# Patient Record
Sex: Female | Born: 1990 | Race: White | Hispanic: No | Marital: Married | Smoking: Current every day smoker
Health system: Southern US, Community
[De-identification: ages and names within clinical notes are randomized; demographics above are authoritative.]

## PROBLEM LIST (undated history)

## (undated) ENCOUNTER — Inpatient Hospital Stay (HOSPITAL_COMMUNITY): Payer: Self-pay

## (undated) DIAGNOSIS — F329 Major depressive disorder, single episode, unspecified: Secondary | ICD-10-CM

## (undated) DIAGNOSIS — T1491XA Suicide attempt, initial encounter: Secondary | ICD-10-CM

## (undated) DIAGNOSIS — F4312 Post-traumatic stress disorder, chronic: Secondary | ICD-10-CM

## (undated) DIAGNOSIS — G4700 Insomnia, unspecified: Secondary | ICD-10-CM

## (undated) DIAGNOSIS — F319 Bipolar disorder, unspecified: Secondary | ICD-10-CM

## (undated) DIAGNOSIS — F32A Depression, unspecified: Secondary | ICD-10-CM

## (undated) DIAGNOSIS — F419 Anxiety disorder, unspecified: Secondary | ICD-10-CM

## (undated) DIAGNOSIS — Z8659 Personal history of other mental and behavioral disorders: Secondary | ICD-10-CM

## (undated) DIAGNOSIS — F603 Borderline personality disorder: Secondary | ICD-10-CM

## (undated) DIAGNOSIS — F1911 Other psychoactive substance abuse, in remission: Secondary | ICD-10-CM

## (undated) HISTORY — DX: Anxiety disorder, unspecified: F41.9

## (undated) HISTORY — DX: Depression, unspecified: F32.A

## (undated) HISTORY — PX: NO PAST SURGERIES: SHX2092

## (undated) HISTORY — DX: Major depressive disorder, single episode, unspecified: F32.9

## (undated) HISTORY — DX: Other psychoactive substance abuse, in remission: F19.11

---

## 2012-05-27 ENCOUNTER — Other Ambulatory Visit (HOSPITAL_COMMUNITY): Payer: Self-pay | Admitting: Obstetrics

## 2012-05-27 DIAGNOSIS — Z3480 Encounter for supervision of other normal pregnancy, unspecified trimester: Secondary | ICD-10-CM

## 2012-05-31 ENCOUNTER — Ambulatory Visit (HOSPITAL_COMMUNITY)
Admission: RE | Admit: 2012-05-31 | Discharge: 2012-05-31 | Disposition: A | Discharge status: Home / Self Care | Payer: Medicaid Other | Source: Ambulatory Visit | Attending: Obstetrics | Admitting: Obstetrics

## 2012-05-31 DIAGNOSIS — Z3480 Encounter for supervision of other normal pregnancy, unspecified trimester: Secondary | ICD-10-CM

## 2012-05-31 DIAGNOSIS — Z3689 Encounter for other specified antenatal screening: Secondary | ICD-10-CM | POA: Insufficient documentation

## 2012-06-20 LAB — OB RESULTS CONSOLE RPR: RPR: NONREACTIVE

## 2012-06-20 LAB — OB RESULTS CONSOLE GC/CHLAMYDIA: Chlamydia: NEGATIVE

## 2012-06-20 LAB — OB RESULTS CONSOLE ABO/RH: RH Type: POSITIVE

## 2012-06-20 LAB — OB RESULTS CONSOLE ANTIBODY SCREEN: Antibody Screen: NEGATIVE

## 2012-10-13 NOTE — L&D Delivery Note (Signed)
Delivery Note At 9:46 PM a viable female was delivered via Vaginal, Spontaneous Delivery (Presentation: ;  ).  APGAR: , ; weight .   Placenta status: , .  Cord:  with the following complications: .  Cord pH: not done  Anesthesia: Epidural  Episiotomy:  Lacerations:  Suture Repair: 2.0 vicryl Est. Blood Loss (mL):   Mom to postpartum.  Baby to nursery-stable.  MARSHALL,BERNARD A 01/03/2013, 9:59 PM

## 2012-11-30 LAB — OB RESULTS CONSOLE GBS: GBS: NEGATIVE

## 2012-12-29 ENCOUNTER — Encounter (HOSPITAL_COMMUNITY): Payer: Self-pay | Admitting: *Deleted

## 2012-12-29 ENCOUNTER — Telehealth (HOSPITAL_COMMUNITY): Payer: Self-pay | Admitting: *Deleted

## 2012-12-29 NOTE — Telephone Encounter (Signed)
Preadmission screen  

## 2013-01-01 ENCOUNTER — Inpatient Hospital Stay (HOSPITAL_COMMUNITY): Admission: AD | Admit: 2013-01-01 | Payer: Self-pay | Source: Ambulatory Visit | Admitting: Obstetrics

## 2013-01-03 ENCOUNTER — Encounter (HOSPITAL_COMMUNITY): Payer: Self-pay

## 2013-01-03 ENCOUNTER — Inpatient Hospital Stay (HOSPITAL_COMMUNITY): Payer: Medicaid Other | Admitting: Anesthesiology

## 2013-01-03 ENCOUNTER — Encounter (HOSPITAL_COMMUNITY): Payer: Self-pay | Admitting: Anesthesiology

## 2013-01-03 ENCOUNTER — Inpatient Hospital Stay (HOSPITAL_COMMUNITY)
Admission: RE | Admit: 2013-01-03 | Discharge: 2013-01-05 | DRG: 775 | Disposition: A | Discharge status: Home / Self Care | Payer: Medicaid Other | Source: Ambulatory Visit | Attending: Obstetrics | Admitting: Obstetrics

## 2013-01-03 LAB — CBC
Hemoglobin: 14.5 g/dL (ref 12.0–15.0)
MCH: 31.2 pg (ref 26.0–34.0)
RBC: 4.65 MIL/uL (ref 3.87–5.11)

## 2013-01-03 LAB — RPR: RPR Ser Ql: NONREACTIVE

## 2013-01-03 MED ORDER — LANOLIN HYDROUS EX OINT: TOPICAL_OINTMENT | CUTANEOUS | Status: DC | PRN

## 2013-01-03 MED ORDER — CITRIC ACID-SODIUM CITRATE 334-500 MG/5ML PO SOLN: 30.0000 mL | ORAL | Status: DC | PRN

## 2013-01-03 MED ORDER — OXYTOCIN 40 UNITS IN LACTATED RINGERS INFUSION - SIMPLE MED
1.0000 m[IU]/min | INTRAVENOUS | Status: DC
  Administered 2013-01-03: 2 m[IU]/min via INTRAVENOUS

## 2013-01-03 MED ORDER — PHENYLEPHRINE 40 MCG/ML (10ML) SYRINGE FOR IV PUSH (FOR BLOOD PRESSURE SUPPORT)
80.0000 ug | PREFILLED_SYRINGE | INTRAVENOUS | Status: DC | PRN
  Filled 2013-01-03: qty 2

## 2013-01-03 MED ORDER — LACTATED RINGERS IV SOLN
INTRAVENOUS | Status: DC
  Administered 2013-01-03 (×3): via INTRAVENOUS

## 2013-01-03 MED ORDER — EPHEDRINE 5 MG/ML INJ
10.0000 mg | INTRAVENOUS | Status: DC | PRN
  Filled 2013-01-03: qty 2

## 2013-01-03 MED ORDER — IBUPROFEN 600 MG PO TABS
600.0000 mg | ORAL_TABLET | Freq: Four times a day (QID) | ORAL | Status: DC
  Administered 2013-01-03 – 2013-01-05 (×6): 600 mg via ORAL
  Filled 2013-01-03: qty 1

## 2013-01-03 MED ORDER — OXYTOCIN 40 UNITS IN LACTATED RINGERS INFUSION - SIMPLE MED
62.5000 mL/h | INTRAVENOUS | Status: DC
  Administered 2013-01-03: 500 mL/h via INTRAVENOUS
  Filled 2013-01-03: qty 1000

## 2013-01-03 MED ORDER — FERROUS SULFATE 325 (65 FE) MG PO TABS
325.0000 mg | ORAL_TABLET | Freq: Two times a day (BID) | ORAL | Status: DC
  Administered 2013-01-04 – 2013-01-05 (×3): 325 mg via ORAL
  Filled 2013-01-03 (×3): qty 1

## 2013-01-03 MED ORDER — OXYTOCIN BOLUS FROM INFUSION: 500.0000 mL | INTRAVENOUS | Status: DC

## 2013-01-03 MED ORDER — OXYCODONE-ACETAMINOPHEN 5-325 MG PO TABS
1.0000 | ORAL_TABLET | ORAL | Status: DC | PRN
  Administered 2013-01-04 (×4): 2 via ORAL
  Administered 2013-01-05: 1 via ORAL
  Filled 2013-01-03: qty 2
  Filled 2013-01-03: qty 1
  Filled 2013-01-03 (×2): qty 2

## 2013-01-03 MED ORDER — LACTATED RINGERS IV SOLN: 500.0000 mL | INTRAVENOUS | Status: DC | PRN

## 2013-01-03 MED ORDER — ONDANSETRON HCL 4 MG/2ML IJ SOLN: 4.0000 mg | INTRAMUSCULAR | Status: DC | PRN

## 2013-01-03 MED ORDER — FENTANYL 2.5 MCG/ML BUPIVACAINE 1/10 % EPIDURAL INFUSION (WH - ANES)
14.0000 mL/h | INTRAMUSCULAR | Status: DC | PRN
  Administered 2013-01-03 (×2): 14 mL/h via EPIDURAL
  Filled 2013-01-03 (×2): qty 125

## 2013-01-03 MED ORDER — PRENATAL MULTIVITAMIN CH
1.0000 | ORAL_TABLET | Freq: Every day | ORAL | Status: DC
  Administered 2013-01-04: 1 via ORAL
  Filled 2013-01-03: qty 1

## 2013-01-03 MED ORDER — MISOPROSTOL 25 MCG QUARTER TABLET
25.0000 ug | ORAL_TABLET | ORAL | Status: DC | PRN
  Administered 2013-01-03: 25 ug via VAGINAL
  Filled 2013-01-03: qty 0.25
  Filled 2013-01-03: qty 1

## 2013-01-03 MED ORDER — ACETAMINOPHEN 325 MG PO TABS: 650.0000 mg | ORAL_TABLET | ORAL | Status: DC | PRN

## 2013-01-03 MED ORDER — EPHEDRINE 5 MG/ML INJ
10.0000 mg | INTRAVENOUS | Status: DC | PRN
  Filled 2013-01-03: qty 2
  Filled 2013-01-03: qty 4

## 2013-01-03 MED ORDER — TETANUS-DIPHTH-ACELL PERTUSSIS 5-2.5-18.5 LF-MCG/0.5 IM SUSP: 0.5000 mL | Freq: Once | INTRAMUSCULAR | Status: DC

## 2013-01-03 MED ORDER — OXYCODONE-ACETAMINOPHEN 5-325 MG PO TABS
1.0000 | ORAL_TABLET | ORAL | Status: DC | PRN
  Administered 2013-01-04 – 2013-01-05 (×2): 1 via ORAL
  Filled 2013-01-03: qty 2
  Filled 2013-01-03: qty 1
  Filled 2013-01-03 (×2): qty 2

## 2013-01-03 MED ORDER — DIBUCAINE 1 % RE OINT
1.0000 "application " | TOPICAL_OINTMENT | RECTAL | Status: DC | PRN
  Administered 2013-01-04: 1 via RECTAL
  Filled 2013-01-03: qty 28

## 2013-01-03 MED ORDER — DIPHENHYDRAMINE HCL 25 MG PO CAPS: 25.0000 mg | ORAL_CAPSULE | Freq: Four times a day (QID) | ORAL | Status: DC | PRN

## 2013-01-03 MED ORDER — LACTATED RINGERS IV SOLN
500.0000 mL | Freq: Once | INTRAVENOUS | Status: AC
  Administered 2013-01-03: 500 mL via INTRAVENOUS

## 2013-01-03 MED ORDER — BENZOCAINE-MENTHOL 20-0.5 % EX AERO
1.0000 "application " | INHALATION_SPRAY | CUTANEOUS | Status: DC | PRN
  Filled 2013-01-03: qty 56

## 2013-01-03 MED ORDER — BUTORPHANOL TARTRATE 1 MG/ML IJ SOLN: 1.0000 mg | INTRAMUSCULAR | Status: DC | PRN

## 2013-01-03 MED ORDER — PHENYLEPHRINE 40 MCG/ML (10ML) SYRINGE FOR IV PUSH (FOR BLOOD PRESSURE SUPPORT)
80.0000 ug | PREFILLED_SYRINGE | INTRAVENOUS | Status: DC | PRN
  Filled 2013-01-03: qty 5
  Filled 2013-01-03: qty 2

## 2013-01-03 MED ORDER — ZOLPIDEM TARTRATE 5 MG PO TABS: 5.0000 mg | ORAL_TABLET | Freq: Every evening | ORAL | Status: DC | PRN

## 2013-01-03 MED ORDER — DIPHENHYDRAMINE HCL 50 MG/ML IJ SOLN: 12.5000 mg | INTRAMUSCULAR | Status: DC | PRN

## 2013-01-03 MED ORDER — ONDANSETRON HCL 4 MG PO TABS: 4.0000 mg | ORAL_TABLET | ORAL | Status: DC | PRN

## 2013-01-03 MED ORDER — WITCH HAZEL-GLYCERIN EX PADS
1.0000 "application " | MEDICATED_PAD | CUTANEOUS | Status: DC | PRN
  Administered 2013-01-04: 1 via TOPICAL

## 2013-01-03 MED ORDER — SENNOSIDES-DOCUSATE SODIUM 8.6-50 MG PO TABS
2.0000 | ORAL_TABLET | Freq: Every day | ORAL | Status: DC
  Administered 2013-01-04: 2 via ORAL

## 2013-01-03 MED ORDER — LIDOCAINE HCL (PF) 1 % IJ SOLN
INTRAMUSCULAR | Status: DC | PRN
  Administered 2013-01-03 (×4): 4 mL

## 2013-01-03 MED ORDER — SIMETHICONE 80 MG PO CHEW: 80.0000 mg | CHEWABLE_TABLET | ORAL | Status: DC | PRN

## 2013-01-03 MED ORDER — LIDOCAINE HCL (PF) 1 % IJ SOLN
30.0000 mL | INTRAMUSCULAR | Status: DC | PRN
  Administered 2013-01-03: 30 mL via SUBCUTANEOUS
  Filled 2013-01-03 (×3): qty 30

## 2013-01-03 MED ORDER — TERBUTALINE SULFATE 1 MG/ML IJ SOLN: 0.2500 mg | Freq: Once | INTRAMUSCULAR | Status: DC | PRN

## 2013-01-03 MED ORDER — ONDANSETRON HCL 4 MG/2ML IJ SOLN: 4.0000 mg | Freq: Four times a day (QID) | INTRAMUSCULAR | Status: DC | PRN

## 2013-01-03 MED ORDER — FLEET ENEMA 7-19 GM/118ML RE ENEM: 1.0000 | ENEMA | RECTAL | Status: DC | PRN

## 2013-01-03 MED ORDER — IBUPROFEN 600 MG PO TABS
600.0000 mg | ORAL_TABLET | Freq: Four times a day (QID) | ORAL | Status: DC | PRN
  Filled 2013-01-03 (×4): qty 1

## 2013-01-03 NOTE — H&P (Signed)
This is Dr. Francoise Ceo dictating the history and physical on Jenna Wilson she's a 22 year old gravida 1 at 40 weeks and 1 day her EDC is 01/02/2013 and she is admitted for induction she was not contracting and received Cytotec at 845 this morning intravaginally she is now 1 cm dilated 70% amniotomy performed the fluids clear and the vertex at -2 station she is also on low-dose Pitocin which was started in in Past medical history is a history of heroin use which was stopped a one-year prior to her pregnancy Past surgical history negative Social history negative System review negative Physical exam well-developed female in no distress HEENT negative Breasts negative Lungs clear to P&A Heart regular rhythm no murmurs no gallops Abdomen term Pelvic as described above Extremities negative and

## 2013-01-03 NOTE — Anesthesia Procedure Notes (Signed)
Epidural Patient location during procedure: OB Start time: 01/03/2013 1:46 PM  Staffing Performed by: anesthesiologist   Preanesthetic Checklist Completed: patient identified, site marked, surgical consent, pre-op evaluation, timeout performed, IV checked, risks and benefits discussed and monitors and equipment checked  Epidural Patient position: sitting Prep: site prepped and draped and DuraPrep Patient monitoring: continuous pulse ox and blood pressure Approach: midline Injection technique: LOR air  Needle:  Needle type: Tuohy  Needle gauge: 17 G Needle length: 9 cm and 9 Needle insertion depth: 5 cm cm Catheter type: closed end flexible Catheter size: 19 Gauge Catheter at skin depth: 10 cm Test dose: negative  Assessment Events: blood not aspirated, injection not painful, no injection resistance, negative IV test and no paresthesia  Additional Notes Discussed risk of headache, infection, bleeding, nerve injury and failed or incomplete block.  Patient voices understanding and wishes to proceed.  Epidural placed easily on first attempt.  No paresthesia.  Patient tolerated procedure well with no apparent complications.  Jasmine December, MD Reason for block:procedure for pain

## 2013-01-03 NOTE — Anesthesia Preprocedure Evaluation (Signed)
Anesthesia Evaluation  Patient identified by MRN, date of birth, ID band Patient awake    Reviewed: Allergy & Precautions, H&P , NPO status , Patient's Chart, lab work & pertinent test results, reviewed documented beta blocker date and time   History of Anesthesia Complications Negative for: history of anesthetic complications  Airway Mallampati: II TM Distance: >3 FB Neck ROM: full    Dental  (+) Teeth Intact   Pulmonary neg pulmonary ROS,  breath sounds clear to auscultation        Cardiovascular negative cardio ROS  Rhythm:regular Rate:Normal     Neuro/Psych negative neurological ROS  negative psych ROS   GI/Hepatic negative GI ROS, (+)     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,   Endo/Other  negative endocrine ROS  Renal/GU negative Renal ROS     Musculoskeletal   Abdominal   Peds  Hematology negative hematology ROS (+)   Anesthesia Other Findings   Reproductive/Obstetrics (+) Pregnancy                           Anesthesia Physical Anesthesia Plan  ASA: II  Anesthesia Plan: Epidural   Post-op Pain Management:    Induction:   Airway Management Planned:   Additional Equipment:   Intra-op Plan:   Post-operative Plan:   Informed Consent: I have reviewed the patients History and Physical, chart, labs and discussed the procedure including the risks, benefits and alternatives for the proposed anesthesia with the patient or authorized representative who has indicated his/her understanding and acceptance.     Plan Discussed with:   Anesthesia Plan Comments:         Anesthesia Quick Evaluation

## 2013-01-04 ENCOUNTER — Encounter (HOSPITAL_COMMUNITY): Payer: Self-pay

## 2013-01-04 LAB — CBC
MCH: 30.1 pg (ref 26.0–34.0)
MCHC: 34.8 g/dL (ref 30.0–36.0)
Platelets: 186 10*3/uL (ref 150–400)

## 2013-01-04 NOTE — Anesthesia Postprocedure Evaluation (Signed)
  Anesthesia Post-op Note  Patient: Jenna Wilson  Procedure(s) Performed: * No procedures listed *  Patient Location: PACU and Mother/Baby  Anesthesia Type:Epidural  Level of Consciousness: awake, alert  and oriented  Airway and Oxygen Therapy: Patient Spontanous Breathing  Post-op Pain: mild  Post-op Assessment: Patient's Cardiovascular Status Stable, Respiratory Function Stable, Patent Airway, Adequate PO intake, Pain level controlled, No headache, No backache, No residual numbness and No residual motor weakness  Post-op Vital Signs: stable  Complications: No apparent anesthesia complications

## 2013-01-04 NOTE — Progress Notes (Signed)
UR chart review completed.  

## 2013-01-04 NOTE — Progress Notes (Signed)
Patient ID: Jenna Wilson, female   DOB: 1991/02/27, 22 y.o.   MRN: [id number] Postpartum day one Vital signs normal Fundus firm Lochia moderate Legs negative doing well and and

## 2013-01-04 NOTE — Clinical Social Work Maternal (Signed)
    Clinical Social Work Department PSYCHOSOCIAL ASSESSMENT - MATERNAL/CHILD 01/04/2013  Patient:  Jenna Wilson, Jenna Wilson  Account Number:  [id number]  Admit Date:  01/03/2013  Marjo Bicker Name:   Jenna Wilson    Clinical Social Worker:  Lulu Riding, LCSW   Date/Time:  01/04/2013 12:45 PM  Date Referred:  01/04/2013   Referral source  CN     Referred reason  Substance Abuse   Other referral source:    I:  FAMILY / HOME ENVIRONMENT Child's legal guardian:  PARENT  Guardian - Name Guardian - Age Guardian - Address  Jenna Wilson [address] [place], [place] [address]  Jenna Wilson     Other household support members/support persons Other support:   MOB reports having a good support system.    II  PSYCHOSOCIAL DATA Information Source:  Patient Interview  Event organiser Employment:   Financial resources:  [employer] If [employer] - County:  [employer] / Grade:   Maternity Care Coordinator / Child Services Coordination / Early Interventions:  Cultural issues impacting care:   None indicated    III  STRENGTHS Strengths  Adequate Resources  Compliance with medical plan  Home prepared for Child (including basic supplies)  Supportive family/friends   Strength comment:    IV  RISK FACTORS AND CURRENT PROBLEMS Current Problem:  None     V  SOCIAL WORK ASSESSMENT  CSW met with MOB and FOB in MOB's first floor room/142 to complete assessment for history of heroine use.  MOB told CSW that CSW could come in and talk with them, but complained about the amount of people in and out of the room, stating they could not get any rest.  CSW offered twice to come back at a later time that might be better for MOB, but she insisted that there would not be a better time and that CSW could talk with her now.  She seemed very tired and CSW does not feel it was an entirely meaningful conversation.  However, MOB gave permission to talk about anything with FOB present and seemed  open about her drug hx.  She states she went to treatment at [facility], a [facility] in [place] approximately 3 years ago and has no used since that time.  CSW commended her on her sobriety.  She reports having a good support system and everything she needs for baby at home.  CSW discussed signs and symptoms of PPD and, given this is her first baby, and gave "Feelings After Birth" handout.  She reports no concerns, questions or needs.     VI SOCIAL WORK PLAN Social Work Plan  No Further Intervention Required / No Barriers to Discharge   Type of pt/family education:   PPD signs and symptoms   If child protective services report - county:   If child protective services report - date:   Information/referral to community resources comment:   Resources given on "Feelings After Birth" handout in the event PPD symptoms arise.   Other social work plan:    MD has ordered a drug screen and CSW will monitor results.

## 2013-01-04 NOTE — Progress Notes (Signed)
Postpartum day one Vital signs normal Fundus firm Lochia moderate Legs negative doing well 

## 2013-01-05 NOTE — Discharge Summary (Signed)
Obstetric Discharge Summary Reason for Admission: induction of labor Prenatal Procedures: none Intrapartum Procedures: spontaneous vaginal delivery Postpartum Procedures: none Complications-Operative and Postpartum: none Hemoglobin  Date Value Range Status  01/04/2013 12.3  12.0 - 15.0 g/dL Final     HCT  Date Value Range Status  01/04/2013 35.3* 36.0 - 46.0 % Final    Physical Exam:  General: alert Lochia: appropriate Uterine Fundus: firm Incision: healing well DVT Evaluation: No evidence of DVT seen on physical exam.  Discharge Diagnoses: Term Pregnancy-delivered  Discharge Information: Date: 01/05/2013 Activity: pelvic rest Diet: routine Medications: Percocet Condition: stable Instructions: refer to practice specific booklet Discharge to: home Follow-up Information   Follow up with Florian Chauca A, MD. Schedule an appointment as soon as possible for a visit in 6 weeks.   Contact information:   [address] [place] [address] [phone]       Newborn Data: Live born female  Birth Weight: 6 lb 15.5 oz (3161 g) APGAR: 9, 9  Home with mother.  Francys Bolin A 01/05/2013, 7:03 AM

## 2013-10-13 NOTE — L&D Delivery Note (Signed)
Delivery Note At 9:16 PM a viable female was delivered via Vaginal, Spontaneous Delivery (Presentation: Middle Occiput Anterior).  APGAR: , ; weight .   Placenta status: Intact, Spontaneous.  Cord: 3 vessels with the following complications: None.  Cord pH: not done  Anesthesia: Epidural  Episiotomy:  Lacerations:  Suture Repair: 2.0 vicryl Est. Blood Loss (mL):   Mom to postpartum.  Baby to Couplet care / Skin to Skin.  Kathreen Cosier 03/21/2014, 9:27 PM

## 2013-11-08 LAB — OB RESULTS CONSOLE GC/CHLAMYDIA
CHLAMYDIA, DNA PROBE: NEGATIVE
GC PROBE AMP, GENITAL: NEGATIVE

## 2013-11-08 LAB — OB RESULTS CONSOLE RUBELLA ANTIBODY, IGM: Rubella: IMMUNE

## 2013-11-08 LAB — OB RESULTS CONSOLE HIV ANTIBODY (ROUTINE TESTING): HIV: NONREACTIVE

## 2013-11-08 LAB — OB RESULTS CONSOLE GBS: GBS: NEGATIVE

## 2013-11-08 LAB — OB RESULTS CONSOLE ABO/RH: RH TYPE: POSITIVE

## 2013-11-08 LAB — OB RESULTS CONSOLE ANTIBODY SCREEN: Antibody Screen: NEGATIVE

## 2013-11-08 LAB — OB RESULTS CONSOLE RPR: RPR: NONREACTIVE

## 2013-11-08 LAB — OB RESULTS CONSOLE HEPATITIS B SURFACE ANTIGEN: HEP B S AG: NEGATIVE

## 2014-02-09 LAB — OB RESULTS CONSOLE GC/CHLAMYDIA
CHLAMYDIA, DNA PROBE: NEGATIVE
Gonorrhea: NEGATIVE

## 2014-02-09 LAB — OB RESULTS CONSOLE GBS: STREP GROUP B AG: NEGATIVE

## 2014-03-11 ENCOUNTER — Inpatient Hospital Stay (HOSPITAL_COMMUNITY)
Admission: AD | Admit: 2014-03-11 | Discharge: 2014-03-11 | Disposition: A | Discharge status: Home / Self Care | Payer: Medicaid Other | Source: Ambulatory Visit | Attending: Obstetrics | Admitting: Obstetrics

## 2014-03-11 ENCOUNTER — Encounter (HOSPITAL_COMMUNITY): Payer: Self-pay | Admitting: *Deleted

## 2014-03-11 DIAGNOSIS — O479 False labor, unspecified: Secondary | ICD-10-CM | POA: Insufficient documentation

## 2014-03-11 MED ORDER — ZOLPIDEM TARTRATE 5 MG PO TABS
5.0000 mg | ORAL_TABLET | Freq: Once | ORAL | Status: AC
  Administered 2014-03-11: 5 mg via ORAL
  Filled 2014-03-11: qty 1

## 2014-03-11 NOTE — MAU Note (Signed)
Contractions started earlier today and now are stronger and closer. Denies bleeding or leaking fld

## 2014-03-17 ENCOUNTER — Telehealth (HOSPITAL_COMMUNITY): Payer: Self-pay | Admitting: *Deleted

## 2014-03-17 ENCOUNTER — Inpatient Hospital Stay (HOSPITAL_COMMUNITY): Payer: Medicaid Other

## 2014-03-17 ENCOUNTER — Encounter (HOSPITAL_COMMUNITY): Payer: Self-pay | Admitting: *Deleted

## 2014-03-17 NOTE — Telephone Encounter (Signed)
Preadmission screen  

## 2014-03-21 ENCOUNTER — Inpatient Hospital Stay (HOSPITAL_COMMUNITY)
Admission: RE | Admit: 2014-03-21 | Discharge: 2014-03-23 | DRG: 775 | Disposition: A | Discharge status: Home / Self Care | Payer: Medicaid Other | Source: Ambulatory Visit | Attending: Obstetrics | Admitting: Obstetrics

## 2014-03-21 ENCOUNTER — Inpatient Hospital Stay (HOSPITAL_COMMUNITY): Payer: Medicaid Other | Admitting: Anesthesiology

## 2014-03-21 ENCOUNTER — Encounter (HOSPITAL_COMMUNITY): Payer: Medicaid Other | Admitting: Anesthesiology

## 2014-03-21 ENCOUNTER — Encounter (HOSPITAL_COMMUNITY): Payer: Self-pay

## 2014-03-21 LAB — CBC
HEMATOCRIT: 39.8 % (ref 36.0–46.0)
Hemoglobin: 14.1 g/dL (ref 12.0–15.0)
MCH: 30.8 pg (ref 26.0–34.0)
MCHC: 35.4 g/dL (ref 30.0–36.0)
MCV: 86.9 fL (ref 78.0–100.0)
PLATELETS: 128 10*3/uL — AB (ref 150–400)
RBC: 4.58 MIL/uL (ref 3.87–5.11)
RDW: 12.9 % (ref 11.5–15.5)
WBC: 9.2 10*3/uL (ref 4.0–10.5)

## 2014-03-21 LAB — RPR

## 2014-03-21 MED ORDER — DIPHENHYDRAMINE HCL 50 MG/ML IJ SOLN: 12.5000 mg | INTRAMUSCULAR | Status: DC | PRN

## 2014-03-21 MED ORDER — PHENYLEPHRINE 40 MCG/ML (10ML) SYRINGE FOR IV PUSH (FOR BLOOD PRESSURE SUPPORT)
80.0000 ug | PREFILLED_SYRINGE | INTRAVENOUS | Status: DC | PRN
  Filled 2014-03-21: qty 2

## 2014-03-21 MED ORDER — METHYLERGONOVINE MALEATE 0.2 MG/ML IJ SOLN
INTRAMUSCULAR | Status: AC
  Filled 2014-03-21: qty 1

## 2014-03-21 MED ORDER — LACTATED RINGERS IV SOLN
500.0000 mL | Freq: Once | INTRAVENOUS | Status: AC
  Administered 2014-03-21: 500 mL via INTRAVENOUS

## 2014-03-21 MED ORDER — OXYTOCIN 40 UNITS IN LACTATED RINGERS INFUSION - SIMPLE MED
1.0000 m[IU]/min | INTRAVENOUS | Status: DC
  Administered 2014-03-21: 2 m[IU]/min via INTRAVENOUS

## 2014-03-21 MED ORDER — LACTATED RINGERS IV SOLN
INTRAVENOUS | Status: DC
  Administered 2014-03-21 (×2): via INTRAVENOUS

## 2014-03-21 MED ORDER — IBUPROFEN 600 MG PO TABS
600.0000 mg | ORAL_TABLET | Freq: Four times a day (QID) | ORAL | Status: DC | PRN
  Administered 2014-03-21: 600 mg via ORAL
  Filled 2014-03-21: qty 1

## 2014-03-21 MED ORDER — CITRIC ACID-SODIUM CITRATE 334-500 MG/5ML PO SOLN: 30.0000 mL | ORAL | Status: DC | PRN

## 2014-03-21 MED ORDER — ACETAMINOPHEN 325 MG PO TABS: 650.0000 mg | ORAL_TABLET | ORAL | Status: DC | PRN

## 2014-03-21 MED ORDER — FLEET ENEMA 7-19 GM/118ML RE ENEM: 1.0000 | ENEMA | RECTAL | Status: DC | PRN

## 2014-03-21 MED ORDER — BUTORPHANOL TARTRATE 1 MG/ML IJ SOLN: 1.0000 mg | INTRAMUSCULAR | Status: DC | PRN

## 2014-03-21 MED ORDER — FENTANYL 2.5 MCG/ML BUPIVACAINE 1/10 % EPIDURAL INFUSION (WH - ANES): 14.0000 mL/h | INTRAMUSCULAR | Status: DC | PRN

## 2014-03-21 MED ORDER — EPHEDRINE 5 MG/ML INJ
10.0000 mg | INTRAVENOUS | Status: DC | PRN
  Filled 2014-03-21: qty 2

## 2014-03-21 MED ORDER — OXYCODONE-ACETAMINOPHEN 5-325 MG PO TABS
1.0000 | ORAL_TABLET | ORAL | Status: DC | PRN
  Administered 2014-03-21: 1 via ORAL
  Filled 2014-03-21 (×2): qty 1

## 2014-03-21 MED ORDER — SODIUM BICARBONATE 8.4 % IV SOLN
INTRAVENOUS | Status: DC | PRN
  Administered 2014-03-21: 5 mL via EPIDURAL

## 2014-03-21 MED ORDER — PHENYLEPHRINE 40 MCG/ML (10ML) SYRINGE FOR IV PUSH (FOR BLOOD PRESSURE SUPPORT)
PREFILLED_SYRINGE | INTRAVENOUS | Status: AC
  Filled 2014-03-21: qty 10

## 2014-03-21 MED ORDER — METHYLERGONOVINE MALEATE 0.2 MG PO TABS
0.2000 mg | ORAL_TABLET | ORAL | Status: AC
  Administered 2014-03-22 – 2014-03-23 (×6): 0.2 mg via ORAL
  Filled 2014-03-21 (×6): qty 1

## 2014-03-21 MED ORDER — FENTANYL 2.5 MCG/ML BUPIVACAINE 1/10 % EPIDURAL INFUSION (WH - ANES)
INTRAMUSCULAR | Status: AC
  Administered 2014-03-21: 14 mL/h via EPIDURAL
  Filled 2014-03-21: qty 125

## 2014-03-21 MED ORDER — TERBUTALINE SULFATE 1 MG/ML IJ SOLN: 0.2500 mg | Freq: Once | INTRAMUSCULAR | Status: AC | PRN

## 2014-03-21 MED ORDER — EPHEDRINE 5 MG/ML INJ
INTRAVENOUS | Status: AC
  Filled 2014-03-21: qty 4

## 2014-03-21 MED ORDER — LACTATED RINGERS IV SOLN: 500.0000 mL | INTRAVENOUS | Status: DC | PRN

## 2014-03-21 MED ORDER — LIDOCAINE HCL (PF) 1 % IJ SOLN
30.0000 mL | INTRAMUSCULAR | Status: DC | PRN
  Filled 2014-03-21: qty 30

## 2014-03-21 MED ORDER — OXYTOCIN 40 UNITS IN LACTATED RINGERS INFUSION - SIMPLE MED
62.5000 mL/h | INTRAVENOUS | Status: DC
  Filled 2014-03-21: qty 1000

## 2014-03-21 MED ORDER — ONDANSETRON HCL 4 MG/2ML IJ SOLN: 4.0000 mg | Freq: Four times a day (QID) | INTRAMUSCULAR | Status: DC | PRN

## 2014-03-21 MED ORDER — OXYTOCIN BOLUS FROM INFUSION: 500.0000 mL | INTRAVENOUS | Status: DC

## 2014-03-21 MED ORDER — METHYLERGONOVINE MALEATE 0.2 MG/ML IJ SOLN
0.2000 mg | Freq: Once | INTRAMUSCULAR | Status: AC
  Administered 2014-03-21: 0.2 mg via INTRAMUSCULAR

## 2014-03-21 NOTE — Anesthesia Procedure Notes (Signed)

## 2014-03-21 NOTE — Anesthesia Preprocedure Evaluation (Signed)

## 2014-03-21 NOTE — H&P (Signed)
This is Dr. Francoise Ceo dictating the history and physical on  Jenna Wilson she's a 23 year old gravida 2 para 1001 at 40 weeks and 5 days EDC 03/16/2014 negative GBS brought in for induction today and she is now 5 cm 70% vertex -2 amniotomy performed the fluids clear she is on Pitocin and contracting every 3-5 minutes Past medical history negative Past surgical history negative Social history negative System review noncontributory Physical exam well-developed female in labor HEENT negative Lungs clear to P&A Heart regular rhythm no murmurs no gallops Breasts negative Abdomen term Pelvic as described above Extremities negative

## 2014-03-22 LAB — CBC
HCT: 36.9 % (ref 36.0–46.0)
Hemoglobin: 12.7 g/dL (ref 12.0–15.0)
MCH: 30.2 pg (ref 26.0–34.0)
MCHC: 34.4 g/dL (ref 30.0–36.0)
MCV: 87.9 fL (ref 78.0–100.0)
PLATELETS: 117 10*3/uL — AB (ref 150–400)
RBC: 4.2 MIL/uL (ref 3.87–5.11)
RDW: 12.9 % (ref 11.5–15.5)
WBC: 15.2 10*3/uL — AB (ref 4.0–10.5)

## 2014-03-22 MED ORDER — FERROUS SULFATE 325 (65 FE) MG PO TABS
325.0000 mg | ORAL_TABLET | Freq: Two times a day (BID) | ORAL | Status: DC
  Administered 2014-03-22 (×2): 325 mg via ORAL
  Filled 2014-03-22 (×2): qty 1

## 2014-03-22 MED ORDER — OXYCODONE-ACETAMINOPHEN 5-325 MG PO TABS
1.0000 | ORAL_TABLET | ORAL | Status: DC | PRN
  Administered 2014-03-22 – 2014-03-23 (×5): 2 via ORAL
  Administered 2014-03-23: 1 via ORAL
  Filled 2014-03-22 (×3): qty 2
  Filled 2014-03-22: qty 1
  Filled 2014-03-22 (×2): qty 2

## 2014-03-22 MED ORDER — SIMETHICONE 80 MG PO CHEW
80.0000 mg | CHEWABLE_TABLET | ORAL | Status: DC | PRN
  Administered 2014-03-22: 80 mg via ORAL
  Filled 2014-03-22: qty 1

## 2014-03-22 MED ORDER — ONDANSETRON HCL 4 MG/2ML IJ SOLN: 4.0000 mg | INTRAMUSCULAR | Status: DC | PRN

## 2014-03-22 MED ORDER — PRENATAL MULTIVITAMIN CH
1.0000 | ORAL_TABLET | Freq: Every day | ORAL | Status: DC
Start: 2014-03-22 — End: 2014-03-23
  Administered 2014-03-22: 1 via ORAL
  Filled 2014-03-22: qty 1

## 2014-03-22 MED ORDER — IBUPROFEN 600 MG PO TABS
600.0000 mg | ORAL_TABLET | Freq: Four times a day (QID) | ORAL | Status: DC
  Administered 2014-03-22 – 2014-03-23 (×6): 600 mg via ORAL
  Filled 2014-03-22 (×6): qty 1

## 2014-03-22 MED ORDER — LANOLIN HYDROUS EX OINT: TOPICAL_OINTMENT | CUTANEOUS | Status: DC | PRN

## 2014-03-22 MED ORDER — DIBUCAINE 1 % RE OINT: 1.0000 "application " | TOPICAL_OINTMENT | RECTAL | Status: DC | PRN

## 2014-03-22 MED ORDER — ZOLPIDEM TARTRATE 5 MG PO TABS
5.0000 mg | ORAL_TABLET | Freq: Every evening | ORAL | Status: DC | PRN
  Administered 2014-03-22: 5 mg via ORAL
  Filled 2014-03-22: qty 1

## 2014-03-22 MED ORDER — SENNOSIDES-DOCUSATE SODIUM 8.6-50 MG PO TABS
2.0000 | ORAL_TABLET | ORAL | Status: DC
  Administered 2014-03-23: 2 via ORAL
  Filled 2014-03-22: qty 2

## 2014-03-22 MED ORDER — DIPHENHYDRAMINE HCL 25 MG PO CAPS: 25.0000 mg | ORAL_CAPSULE | Freq: Four times a day (QID) | ORAL | Status: DC | PRN

## 2014-03-22 MED ORDER — BENZOCAINE-MENTHOL 20-0.5 % EX AERO: 1.0000 "application " | INHALATION_SPRAY | CUTANEOUS | Status: DC | PRN

## 2014-03-22 MED ORDER — METHYLERGONOVINE MALEATE 0.2 MG PO TABS: 0.2000 mg | ORAL_TABLET | ORAL | Status: DC

## 2014-03-22 MED ORDER — WITCH HAZEL-GLYCERIN EX PADS: 1.0000 "application " | MEDICATED_PAD | CUTANEOUS | Status: DC | PRN

## 2014-03-22 MED ORDER — TETANUS-DIPHTH-ACELL PERTUSSIS 5-2.5-18.5 LF-MCG/0.5 IM SUSP: 0.5000 mL | Freq: Once | INTRAMUSCULAR | Status: DC

## 2014-03-22 MED ORDER — ONDANSETRON HCL 4 MG PO TABS: 4.0000 mg | ORAL_TABLET | ORAL | Status: DC | PRN

## 2014-03-22 NOTE — Anesthesia Postprocedure Evaluation (Signed)
Anesthesia Post Note  Patient: Jenna Wilson  Procedure(s) Performed: * No procedures listed *  Anesthesia type: Epidural  Patient location: Mother/Baby  Post pain: Pain level controlled  Post assessment: Post-op Vital signs reviewed  Last Vitals:  Filed Vitals:   03/22/14 0534  BP: 106/71  Pulse: 69  Temp: 36.4 C  Resp: 16    Post vital signs: Reviewed  Level of consciousness:alert  Complications: No apparent anesthesia complications

## 2014-03-22 NOTE — Progress Notes (Signed)
Ur chart review completed.  

## 2014-03-22 NOTE — Progress Notes (Signed)
Patient ID: Jenna Wilson, female   DOB: 1991-09-21, 23 y.o.   MRN: [id number] Postpartum day one Vital signs normal Fundus firm Lochia moderate Legs negative doing well

## 2014-03-23 NOTE — Progress Notes (Signed)
Patient ID: Jenna Wilson, female   DOB: October 02, 1991, 23 y.o.   MRN: [id number] Vital signs normal Fundus firm Lochia moderate Discharge today

## 2014-03-23 NOTE — Discharge Instructions (Signed)
Discharge instructions   You can wash your hair  Shower  Eat what you want  Drink what you want  See me in 6 weeks  Your ankles are going to swell more in the next 2 weeks than when pregnant  No sex for 6 weeks   Holmes Hays A, MD 03/23/2014

## 2014-03-23 NOTE — Discharge Summary (Signed)
Obstetric Discharge Summary Reason for Admission: induction of labor Prenatal Procedures: none Intrapartum Procedures: spontaneous vaginal delivery Postpartum Procedures: none Complications-Operative and Postpartum: none Hemoglobin  Date Value Ref Range Status  03/22/2014 12.7  12.0 - 15.0 g/dL Final     HCT  Date Value Ref Range Status  03/22/2014 36.9  36.0 - 46.0 % Final    Physical Exam:  General: alert Lochia: appropriate Uterine Fundus: firm Incision: healing well DVT Evaluation: No evidence of DVT seen on physical exam.  Discharge Diagnoses: Term Pregnancy-delivered  Discharge Information: Date: 03/23/2014 Activity: pelvic rest Diet: routine Medications: Percocet Condition: stable Instructions: refer to practice specific booklet Discharge to: home Follow-up Information   Follow up with Kathreen Cosier, MD.   Specialty:  Obstetrics and Gynecology   Contact information:   [address] [place] [address] [phone]       Newborn Data: Live born female  Birth Weight: 7 lb 1.1 oz (3205 g) APGAR: 9, 9  Home with mother.  MARSHALL,BERNARD A 03/23/2014, 7:04 AM

## 2014-05-05 DIAGNOSIS — O99345 Other mental disorders complicating the puerperium: Secondary | ICD-10-CM

## 2014-05-05 DIAGNOSIS — F53 Postpartum depression: Secondary | ICD-10-CM | POA: Insufficient documentation

## 2014-08-14 ENCOUNTER — Encounter (HOSPITAL_COMMUNITY): Payer: Self-pay

## 2016-05-06 DIAGNOSIS — F411 Generalized anxiety disorder: Secondary | ICD-10-CM | POA: Insufficient documentation

## 2016-07-05 LAB — HM PAP SMEAR

## 2016-11-25 ENCOUNTER — Ambulatory Visit (HOSPITAL_COMMUNITY)
Admission: RE | Admit: 2016-11-25 | Discharge: 2016-11-25 | Disposition: A | Discharge status: Home / Self Care | Payer: 59 | Attending: Psychiatry | Admitting: Psychiatry

## 2016-11-25 DIAGNOSIS — F332 Major depressive disorder, recurrent severe without psychotic features: Secondary | ICD-10-CM | POA: Insufficient documentation

## 2016-11-25 DIAGNOSIS — Z79899 Other long term (current) drug therapy: Secondary | ICD-10-CM | POA: Insufficient documentation

## 2016-11-25 NOTE — Consult Note (Signed)
Behavioral Health Medical Screening Exam  Jenna Wilson is an 26 y.o. female.  Total Time spent with patient: 15 minutes  Psychiatric Specialty Exam: Physical Exam  Constitutional: She is oriented to person, place, and time. She appears well-developed and well-nourished.  HENT:  Head: Normocephalic and atraumatic.  Eyes: Conjunctivae are normal. Pupils are equal, round, and reactive to light.  Neck: Normal range of motion.  Cardiovascular: Normal rate, regular rhythm and normal heart sounds.   Respiratory: Effort normal and breath sounds normal.  GI: Soft. Bowel sounds are normal.  Musculoskeletal: Normal range of motion.  Neurological: She is alert and oriented to person, place, and time.  Skin: Skin is warm and dry.    Review of Systems  Psychiatric/Behavioral: Positive for depression. Negative for hallucinations and suicidal ideas. The patient is nervous/anxious and has insomnia.   All other systems reviewed and are negative.   Blood pressure 107/75, pulse 80, temperature 98.2 F (36.8 C), resp. rate 18, SpO2 100 %, unknown if currently breastfeeding.There is no height or weight on file to calculate BMI.  General Appearance: Casual and Fairly Groomed  Eye Contact:  Good  Speech:  Clear and Coherent and Normal Rate  Volume:  Normal  Mood:  Anxious and Depressed  Affect:  Appropriate, Congruent and Depressed  Thought Process:  Coherent, Goal Directed, Linear and Descriptions of Associations: Intact  Orientation:  Full (Time, Place, and Person)  Thought Content:  Focused on coming in  Suicidal Thoughts:  denies  Homicidal Thoughts:  No  Memory:  Immediate;   Fair Recent;   Fair Remote;   Fair  Judgement:  Fair  Insight:  Fair  Psychomotor Activity:  Normal  Concentration: Concentration: Fair and Attention Span: Fair  Recall:  [employer]  Fund of Knowledge:Fair  Language: Fair  Akathisia:  No  Handed:    AIMS (if indicated):     Assets:  Communication Skills Desire for  Improvement Resilience Social Support  Sleep:       Musculoskeletal: Strength & Muscle Tone: within normal limits Gait & Station: normal Patient leans: N/A  Blood pressure 107/75, pulse 80, temperature 98.2 F (36.8 C), resp. rate 18, SpO2 100 %, unknown if currently breastfeeding.  Recommendations:  Based on my evaluation the patient does not appear to have an emergency medical condition.  Beau FannyWithrow, Petrita Blunck C, FNP 11/25/2016, 4:12 PM
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
## 2016-11-26 ENCOUNTER — Telehealth (HOSPITAL_COMMUNITY): Payer: Self-pay | Admitting: Psychiatry

## 2016-11-26 NOTE — Telephone Encounter (Signed)
11/26/16 10:24am Patient stated that she is separated from her husband but he has agreed to pay her medical/mental health bills.  I Nettie Elm(Sylvia) asked if she was sure that the husband is going to pay any bills that he may receive according to the patient she stated that he has agreed to do so - the patient also stated that she has flex spending for co-pays but she doesn't have the card, informed the patient that she would have to at least pay something on her co-pay - also suggested that she call the insurance company.  This patient ordinally came for an CCA for the IOP group with [facility] - the patient was given a [facility] application and [employer] application.  Nettie ElmSylvia

## 2016-11-27 ENCOUNTER — Encounter (HOSPITAL_COMMUNITY): Payer: Self-pay | Admitting: Psychiatry

## 2016-11-27 ENCOUNTER — Ambulatory Visit (INDEPENDENT_AMBULATORY_CARE_PROVIDER_SITE_OTHER): Payer: 59 | Admitting: Psychiatry

## 2016-11-27 ENCOUNTER — Encounter (INDEPENDENT_AMBULATORY_CARE_PROVIDER_SITE_OTHER): Payer: Self-pay

## 2016-11-27 VITALS — BP 98/68 | HR 88 | Ht 62.0 in | Wt 153.6 lb

## 2016-11-27 DIAGNOSIS — Z810 Family history of intellectual disabilities: Secondary | ICD-10-CM | POA: Diagnosis not present

## 2016-11-27 DIAGNOSIS — F4312 Post-traumatic stress disorder, chronic: Secondary | ICD-10-CM | POA: Diagnosis not present

## 2016-11-27 DIAGNOSIS — Z813 Family history of other psychoactive substance abuse and dependence: Secondary | ICD-10-CM | POA: Diagnosis not present

## 2016-11-27 DIAGNOSIS — Z79899 Other long term (current) drug therapy: Secondary | ICD-10-CM

## 2016-11-27 DIAGNOSIS — F409 Phobic anxiety disorder, unspecified: Secondary | ICD-10-CM | POA: Insufficient documentation

## 2016-11-27 DIAGNOSIS — Z818 Family history of other mental and behavioral disorders: Secondary | ICD-10-CM

## 2016-11-27 DIAGNOSIS — F5105 Insomnia due to other mental disorder: Secondary | ICD-10-CM

## 2016-11-27 DIAGNOSIS — Z811 Family history of alcohol abuse and dependence: Secondary | ICD-10-CM

## 2016-11-27 MED ORDER — HYDROXYZINE PAMOATE 25 MG PO CAPS: 25.0000 mg | ORAL_CAPSULE | Freq: Three times a day (TID) | ORAL | 0 refills | Status: DC | PRN

## 2016-11-27 MED ORDER — PRAZOSIN HCL 2 MG PO CAPS: 2.0000 mg | ORAL_CAPSULE | Freq: Every day | ORAL | 2 refills | Status: DC

## 2016-11-27 MED ORDER — SERTRALINE HCL 100 MG PO TABS: 100.0000 mg | ORAL_TABLET | Freq: Every day | ORAL | 2 refills | Status: DC
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
## 2016-12-03 ENCOUNTER — Telehealth (HOSPITAL_COMMUNITY): Payer: Self-pay

## 2016-12-03 NOTE — Telephone Encounter (Signed)
Thank you :)

## 2016-12-03 NOTE — Telephone Encounter (Signed)
Patient is calling because the Hydroxyzine is not working for her anxiety. Patient wanted to move her appointment to come in sooner, I scheduled her for tomorrow morning at 9 am

## 2016-12-04 ENCOUNTER — Ambulatory Visit (HOSPITAL_COMMUNITY): Payer: Self-pay | Admitting: Psychiatry

## 2016-12-11 ENCOUNTER — Ambulatory Visit (HOSPITAL_COMMUNITY): Payer: Self-pay | Admitting: Psychiatry

## 2016-12-16 ENCOUNTER — Ambulatory Visit (HOSPITAL_COMMUNITY): Payer: Self-pay | Admitting: Psychiatry

## 2017-01-06 ENCOUNTER — Telehealth (HOSPITAL_COMMUNITY): Payer: Self-pay | Admitting: Psychiatry

## 2017-01-07 NOTE — Telephone Encounter (Signed)
I'll give her a call. Thank you 

## 2017-02-12 ENCOUNTER — Encounter (HOSPITAL_COMMUNITY): Payer: Self-pay | Admitting: *Deleted

## 2017-02-12 ENCOUNTER — Inpatient Hospital Stay (HOSPITAL_COMMUNITY): Payer: Managed Care, Other (non HMO)

## 2017-02-12 ENCOUNTER — Inpatient Hospital Stay (HOSPITAL_COMMUNITY)
Admission: AD | Admit: 2017-02-12 | Discharge: 2017-02-12 | Discharge status: Against Medical Advice | Payer: Managed Care, Other (non HMO) | Source: Ambulatory Visit | Attending: Obstetrics and Gynecology | Admitting: Obstetrics and Gynecology

## 2017-02-12 DIAGNOSIS — Z3202 Encounter for pregnancy test, result negative: Secondary | ICD-10-CM | POA: Diagnosis not present

## 2017-02-12 DIAGNOSIS — F1910 Other psychoactive substance abuse, uncomplicated: Secondary | ICD-10-CM | POA: Diagnosis not present

## 2017-02-12 DIAGNOSIS — N939 Abnormal uterine and vaginal bleeding, unspecified: Secondary | ICD-10-CM | POA: Diagnosis not present

## 2017-02-12 DIAGNOSIS — R109 Unspecified abdominal pain: Secondary | ICD-10-CM | POA: Insufficient documentation

## 2017-02-12 DIAGNOSIS — R451 Restlessness and agitation: Secondary | ICD-10-CM | POA: Insufficient documentation

## 2017-02-12 DIAGNOSIS — F17210 Nicotine dependence, cigarettes, uncomplicated: Secondary | ICD-10-CM | POA: Diagnosis not present

## 2017-02-12 DIAGNOSIS — Z5321 Procedure and treatment not carried out due to patient leaving prior to being seen by health care provider: Secondary | ICD-10-CM | POA: Diagnosis not present

## 2017-02-12 LAB — POCT PREGNANCY, URINE: PREG TEST UR: NEGATIVE

## 2017-02-12 LAB — URINALYSIS, ROUTINE W REFLEX MICROSCOPIC
Bilirubin Urine: NEGATIVE
Glucose, UA: NEGATIVE mg/dL
Ketones, ur: NEGATIVE mg/dL
Leukocytes, UA: NEGATIVE
Nitrite: NEGATIVE
Protein, ur: NEGATIVE mg/dL
Specific Gravity, Urine: 1.005 (ref 1.005–1.030)
pH: 6 (ref 5.0–8.0)

## 2017-02-12 LAB — CBC
HCT: 40.4 % (ref 36.0–46.0)
Hemoglobin: 14.4 g/dL (ref 12.0–15.0)
MCH: 31.4 pg (ref 26.0–34.0)
MCHC: 35.6 g/dL (ref 30.0–36.0)
MCV: 88.2 fL (ref 78.0–100.0)
Platelets: 296 10*3/uL (ref 150–400)
RBC: 4.58 MIL/uL (ref 3.87–5.11)
RDW: 13 % (ref 11.5–15.5)
WBC: 13.3 10*3/uL — ABNORMAL HIGH (ref 4.0–10.5)

## 2017-02-12 LAB — RAPID URINE DRUG SCREEN, HOSP PERFORMED
Amphetamines: POSITIVE — AB
Barbiturates: NOT DETECTED
Benzodiazepines: NOT DETECTED
COCAINE: POSITIVE — AB
Opiates: NOT DETECTED
Tetrahydrocannabinol: POSITIVE — AB

## 2017-02-12 LAB — DIFFERENTIAL
BASOS ABS: 0.1 10*3/uL (ref 0.0–0.1)
Basophils Relative: 0 %
Eosinophils Absolute: 0 10*3/uL (ref 0.0–0.7)
Eosinophils Relative: 0 %
LYMPHS PCT: 20 %
Lymphs Abs: 2.7 10*3/uL (ref 0.7–4.0)
MONO ABS: 0.5 10*3/uL (ref 0.1–1.0)
Monocytes Relative: 3 %
NEUTROS ABS: 10.2 10*3/uL — AB (ref 1.7–7.7)
NEUTROS PCT: 77 %

## 2017-02-12 LAB — HCG, QUANTITATIVE, PREGNANCY: hCG, Beta Chain, Quant, S: <1 m[IU]/mL (ref <5)

## 2017-02-12 NOTE — MAU Note (Signed)
Pt brought in by EMS. Stated she went to the BR had some blood and started having some abd pain.  Then her stomach started popping out. Pain got worse like contractions. Reports she has had normal periods last one was 02/01/2017.

## 2017-02-12 NOTE — MAU Note (Signed)
Provider at bedside using U/S. Turned screen so pt could see that there was no full term baby in her uterus. No baby could be seen at all at this time. Pt insisted  that she must be pregnant and the baby must be "further back so you cant;'s see it" No other reason why she is feeling this way according to patient. Formal u/s and labs ordered.

## 2017-02-12 NOTE — MAU Provider Note (Signed)
History     CSN: [id number]  Arrival date and time: 02/12/17 1240  None     Chief Complaint  Patient presents with  . Abdominal Pain   HPI  Jenna Wilson is a 26 y.o. 482P2002 female who presents via EMS with abdominal pain & vaginal bleeding. Reports going to the bathroom & noting some blood on the toilet paper; immediately started having "contractions" every 3-5 minutes and reports her abdomen becoming distended. Denies n/v/d, constipation, or dysuria. Reports monthly menses for the last year & has taken plan B "several times". Has not had positive pregnancy test at home but states she is "obviously pregnant". Difficult to obtain history d/t patient's agitation. Patient yelling & cursing at nurses as she is being checked in.   OB History    Gravida Para Term Preterm AB Living   2 2 2     2    SAB TAB Ectopic Multiple Live Births           2      Past Medical History:  Diagnosis Date  . Anxiety   . Depression   . Hx of drug abuse    past heroin addict    Past Surgical History:  Procedure Laterality Date  . NO PAST SURGERIES      Family History  Problem Relation Age of Onset  . Alcohol abuse Neg Hx   . Arthritis Neg Hx   . Asthma Neg Hx   . Birth defects Neg Hx   . Cancer Neg Hx   . COPD Neg Hx   . Depression Neg Hx   . Diabetes Neg Hx   . Drug abuse Neg Hx   . Early death Neg Hx   . Hearing loss Neg Hx   . Heart disease Neg Hx   . Hyperlipidemia Neg Hx   . Hypertension Neg Hx   . Kidney disease Neg Hx   . Learning disabilities Neg Hx   . Mental illness Neg Hx   . Mental retardation Neg Hx   . Miscarriages / Stillbirths Neg Hx   . Stroke Neg Hx   . Vision loss Neg Hx   . Varicose Veins Neg Hx     Social History  Substance Use Topics  . Smoking status: Current Every Day Smoker    Types: Cigarettes  . Smokeless tobacco: Never Used  . Alcohol use 0.6 - 1.2 oz/week    1 - 2 Glasses of wine per week     Comment: every 2 -3 weeks    Allergies: No Known  Allergies  No prescriptions prior to admission.    Review of Systems  Gastrointestinal: Positive for abdominal pain. Negative for constipation, diarrhea, nausea and vomiting.  Genitourinary: Positive for vaginal bleeding. Negative for dysuria.   Physical Exam   Blood pressure 128/87, pulse (!) 115, temperature 97.9 F (36.6 C), temperature source Oral, resp. rate 18, last menstrual period 02/01/2017, unknown if currently breastfeeding.  Physical Exam  Nursing note and vitals reviewed. Constitutional: She is oriented to person, place, and time. She appears well-developed and well-nourished. She is uncooperative. No distress.  HENT:  Head: Normocephalic and atraumatic.  Eyes: Conjunctivae are normal. Right eye exhibits no discharge. Left eye exhibits no discharge. No scleral icterus.  Neck: Normal range of motion.  Cardiovascular: Normal rate, regular rhythm and normal heart sounds.   No murmur heard. Respiratory: Effort normal and breath sounds normal. No respiratory distress. She has no wheezes.  GI: Soft. Bowel sounds are  normal. There is no tenderness. There is no rigidity, no rebound and no guarding.  Neurological: She is alert and oriented to person, place, and time.  Skin: Skin is warm and dry. She is not diaphoretic.  Psychiatric: Judgment and thought content normal. Her affect is angry. She is agitated and aggressive.    MAU Course  Procedures Results for orders placed or performed during the hospital encounter of 02/12/17 (from the past 24 hour(s))  Urinalysis, Routine w reflex microscopic (not at [facility])     Status: Abnormal   Collection Time: 02/12/17 12:52 PM  Result Value Ref Range   Color, Urine STRAW (A) YELLOW   APPearance CLEAR CLEAR   Specific Gravity, Urine 1.005 1.005 - 1.030   pH 6.0 5.0 - 8.0   Glucose, UA NEGATIVE NEGATIVE mg/dL   Hgb urine dipstick MODERATE (A) NEGATIVE   Bilirubin Urine NEGATIVE NEGATIVE   Ketones, ur NEGATIVE NEGATIVE mg/dL    Protein, ur NEGATIVE NEGATIVE mg/dL   Nitrite NEGATIVE NEGATIVE   Leukocytes, UA NEGATIVE NEGATIVE   RBC / HPF 0-5 0 - 5 RBC/hpf   WBC, UA 0-5 0 - 5 WBC/hpf   Bacteria, UA FEW (A) NONE SEEN   Squamous Epithelial / LPF 0-5 (A) NONE SEEN  Rapid urine drug screen (hospital performed)     Status: Abnormal   Collection Time: 02/12/17  1:00 PM  Result Value Ref Range   Opiates NONE DETECTED NONE DETECTED   Cocaine POSITIVE (A) NONE DETECTED   Benzodiazepines NONE DETECTED NONE DETECTED   Amphetamines POSITIVE (A) NONE DETECTED   Tetrahydrocannabinol POSITIVE (A) NONE DETECTED   Barbiturates NONE DETECTED NONE DETECTED  hCG, quantitative, pregnancy     Status: None   Collection Time: 02/12/17  1:23 PM  Result Value Ref Range   hCG, Beta Chain, Quant, S <1 <5 mIU/mL  CBC     Status: Abnormal   Collection Time: 02/12/17  1:23 PM  Result Value Ref Range   WBC 13.3 (H) 4.0 - 10.5 K/uL   RBC 4.58 3.87 - 5.11 MIL/uL   Hemoglobin 14.4 12.0 - 15.0 g/dL   HCT 13.2 44.0 - 10.2 %   MCV 88.2 78.0 - 100.0 fL   MCH 31.4 26.0 - 34.0 pg   MCHC 35.6 30.0 - 36.0 g/dL   RDW 72.5 36.6 - 44.0 %   Platelets 296 150 - 400 K/uL  Differential     Status: Abnormal   Collection Time: 02/12/17  1:23 PM  Result Value Ref Range   Neutrophils Relative % 77 %   Neutro Abs 10.2 (H) 1.7 - 7.7 K/uL   Lymphocytes Relative 20 %   Lymphs Abs 2.7 0.7 - 4.0 K/uL   Monocytes Relative 3 %   Monocytes Absolute 0.5 0.1 - 1.0 K/uL   Eosinophils Relative 0 %   Eosinophils Absolute 0.0 0.0 - 0.7 K/uL   Basophils Relative 0 %   Basophils Absolute 0.1 0.0 - 0.1 K/uL  Pregnancy, urine POC     Status: None   Collection Time: 02/12/17  2:11 PM  Result Value Ref Range   Preg Test, Ur NEGATIVE NEGATIVE   US Transvaginal Non-ob  Result Date: 02/12/2017 CLINICAL DATA:  Gravida 2 para 2. Lower abdominal pain since this morning. LMP 02/01/2017. EXAM: TRANSABDOMINAL AND TRANSVAGINAL ULTRASOUND OF PELVIS TECHNIQUE: Both  transabdominal and transvaginal ultrasound examinations of the pelvis were performed. Transabdominal technique was performed for global imaging of the pelvis including uterus, ovaries, adnexal regions, and pelvic  cul-de-sac. It was necessary to proceed with endovaginal exam following the transabdominal exam to visualize the endometrium. COMPARISON:  Previous OB ultrasound 05/31/2012 FINDINGS: Uterus Measurements: 7.1 x 3.6 x 4.5 cm. No fibroids or other mass visualized. Endometrium Thickness: 3.2 mm.  No focal abnormality visualized. Right ovary Measurements: 4.0 x 2.4 x 3.1 cm. Normal appearance/no adnexal mass. Left ovary Measurements: 2.8 x 1.7 x 1.7 cm. Normal appearance/no adnexal mass. Other findings No abnormal free fluid. IMPRESSION: Normal pelvic ultrasound. Electronically Signed   By: Norva Pavlov M.D.   On: 02/12/2017 15:13   US Pelvis Complete  Result Date: 02/12/2017 CLINICAL DATA:  Gravida 2 para 2. Lower abdominal pain since this morning. LMP 02/01/2017. EXAM: TRANSABDOMINAL AND TRANSVAGINAL ULTRASOUND OF PELVIS TECHNIQUE: Both transabdominal and transvaginal ultrasound examinations of the pelvis were performed. Transabdominal technique was performed for global imaging of the pelvis including uterus, ovaries, adnexal regions, and pelvic cul-de-sac. It was necessary to proceed with endovaginal exam following the transabdominal exam to visualize the endometrium. COMPARISON:  Previous OB ultrasound 05/31/2012 FINDINGS: Uterus Measurements: 7.1 x 3.6 x 4.5 cm. No fibroids or other mass visualized. Endometrium Thickness: 3.2 mm.  No focal abnormality visualized. Right ovary Measurements: 4.0 x 2.4 x 3.1 cm. Normal appearance/no adnexal mass. Left ovary Measurements: 2.8 x 1.7 x 1.7 cm. Normal appearance/no adnexal mass. Other findings No abnormal free fluid. IMPRESSION: Normal pelvic ultrasound. Electronically Signed   By: Norva Pavlov M.D.   On: 02/12/2017 15:13    MDM Unable to doppler  fetal heart tones Informal bedside ultrasound shows no fetus -- patient adamant that she is pregnant & baby "must be hiding behind something" Waiitng for urine sample to perform UPT -- d/t patient behavior & agitation will order labs & ultasound UPT negative Patient walked out of hospital immediately after being brought back from ultrasound  Assessment and Plan  A: 1. Pregnancy examination or test, negative result   2. Abdominal pain    Patient left AMA  Judeth Horn 02/12/2017, 3:44 PM

## 2017-02-12 NOTE — MAU Note (Signed)
Pt back from U/S. Walked out and stated her husband is here and had to leave. Did not stay for results. Provider aware.

## 2017-03-07 ENCOUNTER — Encounter (HOSPITAL_COMMUNITY): Payer: Self-pay

## 2017-03-07 ENCOUNTER — Emergency Department (HOSPITAL_COMMUNITY)
Admission: EM | Admit: 2017-03-07 | Discharge: 2017-03-09 | Disposition: A | Discharge status: Home / Self Care | Payer: Managed Care, Other (non HMO) | Attending: Emergency Medicine | Admitting: Emergency Medicine

## 2017-03-07 DIAGNOSIS — G43909 Migraine, unspecified, not intractable, without status migrainosus: Secondary | ICD-10-CM | POA: Diagnosis not present

## 2017-03-07 DIAGNOSIS — F1414 Cocaine abuse with cocaine-induced mood disorder: Secondary | ICD-10-CM | POA: Diagnosis not present

## 2017-03-07 DIAGNOSIS — F17210 Nicotine dependence, cigarettes, uncomplicated: Secondary | ICD-10-CM | POA: Insufficient documentation

## 2017-03-07 DIAGNOSIS — Z79899 Other long term (current) drug therapy: Secondary | ICD-10-CM | POA: Diagnosis not present

## 2017-03-07 DIAGNOSIS — G4700 Insomnia, unspecified: Secondary | ICD-10-CM | POA: Diagnosis not present

## 2017-03-07 DIAGNOSIS — R45851 Suicidal ideations: Secondary | ICD-10-CM | POA: Diagnosis present

## 2017-03-07 LAB — CBC
HEMATOCRIT: 44.8 % (ref 36.0–46.0)
HEMOGLOBIN: 15.8 g/dL — AB (ref 12.0–15.0)
MCH: 31.5 pg (ref 26.0–34.0)
MCHC: 35.3 g/dL (ref 30.0–36.0)
MCV: 89.4 fL (ref 78.0–100.0)
Platelets: 351 10*3/uL (ref 150–400)
RBC: 5.01 MIL/uL (ref 3.87–5.11)
RDW: 13 % (ref 11.5–15.5)
WBC: 10 10*3/uL (ref 4.0–10.5)

## 2017-03-07 LAB — I-STAT BETA HCG BLOOD, ED (MC, WL, AP ONLY): I-stat hCG, quantitative: <5 m[IU]/mL (ref <5)

## 2017-03-07 LAB — RAPID URINE DRUG SCREEN, HOSP PERFORMED
Amphetamines: NOT DETECTED
BARBITURATES: NOT DETECTED
Benzodiazepines: NOT DETECTED
COCAINE: POSITIVE — AB
Opiates: NOT DETECTED
TETRAHYDROCANNABINOL: POSITIVE — AB

## 2017-03-07 NOTE — ED Triage Notes (Addendum)
Pt states they are suicidal and have been using crack cocaine for about a week. Denies any specific plan. Last usage this morning.

## 2017-03-08 ENCOUNTER — Emergency Department (HOSPITAL_COMMUNITY): Payer: Managed Care, Other (non HMO)

## 2017-03-08 ENCOUNTER — Encounter (HOSPITAL_COMMUNITY): Payer: Self-pay

## 2017-03-08 LAB — COMPREHENSIVE METABOLIC PANEL
ALT: 20 U/L (ref 14–54)
ANION GAP: 7 (ref 5–15)
AST: 26 U/L (ref 15–41)
Albumin: 4.1 g/dL (ref 3.5–5.0)
Alkaline Phosphatase: 90 U/L (ref 38–126)
BILIRUBIN TOTAL: 0.4 mg/dL (ref 0.3–1.2)
BUN: 12 mg/dL (ref 6–20)
CHLORIDE: 108 mmol/L (ref 101–111)
CO2: 26 mmol/L (ref 22–32)
Calcium: 9 mg/dL (ref 8.9–10.3)
Creatinine, Ser: 1.29 mg/dL — ABNORMAL HIGH (ref 0.44–1.00)
GFR, EST NON AFRICAN AMERICAN: 56 mL/min — AB (ref >60)
Glucose, Bld: 105 mg/dL — ABNORMAL HIGH (ref 65–99)
POTASSIUM: 3.7 mmol/L (ref 3.5–5.1)
Sodium: 141 mmol/L (ref 135–145)
TOTAL PROTEIN: 7.8 g/dL (ref 6.5–8.1)

## 2017-03-08 LAB — SALICYLATE LEVEL

## 2017-03-08 LAB — ETHANOL

## 2017-03-08 LAB — I-STAT BETA HCG BLOOD, ED (MC, WL, AP ONLY)

## 2017-03-08 LAB — TROPONIN I: Troponin I: <0.03 ng/mL (ref <0.03)

## 2017-03-08 LAB — ACETAMINOPHEN LEVEL

## 2017-03-08 MED ORDER — ZOLPIDEM TARTRATE 5 MG PO TABS
5.0000 mg | ORAL_TABLET | Freq: Every evening | ORAL | Status: DC | PRN
  Administered 2017-03-08: 5 mg via ORAL
  Filled 2017-03-08: qty 1

## 2017-03-08 MED ORDER — ONDANSETRON HCL 4 MG PO TABS: 4.0000 mg | ORAL_TABLET | Freq: Three times a day (TID) | ORAL | Status: DC | PRN

## 2017-03-08 MED ORDER — LORAZEPAM 2 MG/ML IJ SOLN
1.0000 mg | Freq: Once | INTRAMUSCULAR | Status: AC
  Administered 2017-03-08: 1 mg via INTRAVENOUS
  Filled 2017-03-08: qty 1

## 2017-03-08 MED ORDER — SERTRALINE HCL 50 MG PO TABS
100.0000 mg | ORAL_TABLET | Freq: Every day | ORAL | Status: DC
  Administered 2017-03-08 – 2017-03-09 (×2): 100 mg via ORAL
  Filled 2017-03-08 (×2): qty 2

## 2017-03-08 MED ORDER — LORAZEPAM 2 MG/ML IJ SOLN
2.0000 mg | Freq: Once | INTRAMUSCULAR | Status: AC
  Administered 2017-03-08: 2 mg via INTRAVENOUS
  Filled 2017-03-08 (×2): qty 1

## 2017-03-08 MED ORDER — HYDROXYZINE HCL 25 MG PO TABS
25.0000 mg | ORAL_TABLET | Freq: Three times a day (TID) | ORAL | Status: DC | PRN
Start: 2017-03-08 — End: 2017-03-09
  Administered 2017-03-08 – 2017-03-09 (×3): 25 mg via ORAL
  Filled 2017-03-08 (×3): qty 1

## 2017-03-08 MED ORDER — ASPIRIN 81 MG PO CHEW
324.0000 mg | CHEWABLE_TABLET | Freq: Once | ORAL | Status: AC
  Administered 2017-03-08: 324 mg via ORAL
  Filled 2017-03-08 (×2): qty 4

## 2017-03-08 MED ORDER — PRAZOSIN HCL 1 MG PO CAPS
2.0000 mg | ORAL_CAPSULE | Freq: Every day | ORAL | Status: DC
  Administered 2017-03-08: 2 mg via ORAL
  Filled 2017-03-08 (×2): qty 2

## 2017-03-08 MED ORDER — IBUPROFEN 200 MG PO TABS
600.0000 mg | ORAL_TABLET | Freq: Three times a day (TID) | ORAL | Status: DC | PRN
  Administered 2017-03-08 – 2017-03-09 (×2): 600 mg via ORAL
  Filled 2017-03-08 (×2): qty 3

## 2017-03-08 NOTE — ED Notes (Signed)
Patient transported to X-ray 

## 2017-03-08 NOTE — ED Notes (Signed)
Pt has been seen and wand by security.  Pt has one bag of belongings.  Pt's family member has wallet and cellphone.

## 2017-03-08 NOTE — ED Notes (Signed)
Bed: WA11 Expected date:  Expected time:  Means of arrival:  Comments: Triage 4  

## 2017-03-08 NOTE — ED Notes (Signed)
Patient verbally abusive. Stated, "I want some G-- D--- sleep     not some F------ Tylenol." Attempted to explain that it was Aspirin and why she was getting it. Patient then replied. "I don't want it."

## 2017-03-08 NOTE — BH Assessment (Addendum)
Tele Assessment Note   Jenna Wilson is an 26 y.o. female.  Pt reports SI with no plan. Pt states "I think about suicide daily." Pt reports daily crack cocaine use. Pt states she uses $500 of crack cocaine a day. Pt reports multiple MH and SA hospitalizations. Pt cannot remember her last hospitalization. Pt denies HI and AVH. Pt denies current outpatient treatment. Pt denies being prescribed current mental medication. Pt reports depressive sypmtoms. Pt reports decreased sleep and appetite. Pt states she is currently depressed due to the loss of her children through CPS and her drug use. Pt denies abuse.   Per Dr. Vanetta Shawl and Denice Bors, NP Pt meets inpatient criteria. TTS to seek placement.  Diagnosis:  F11.20 Opioid use severe; F33.2 MDD  Past Medical History:  Past Medical History:  Diagnosis Date  . Anxiety   . Depression   . Hx of drug abuse    past heroin addict    Past Surgical History:  Procedure Laterality Date  . NO PAST SURGERIES      Family History:  Family History  Problem Relation Age of Onset  . Alcohol abuse Neg Hx   . Arthritis Neg Hx   . Asthma Neg Hx   . Birth defects Neg Hx   . Cancer Neg Hx   . COPD Neg Hx   . Depression Neg Hx   . Diabetes Neg Hx   . Drug abuse Neg Hx   . Early death Neg Hx   . Hearing loss Neg Hx   . Heart disease Neg Hx   . Hyperlipidemia Neg Hx   . Hypertension Neg Hx   . Kidney disease Neg Hx   . Learning disabilities Neg Hx   . Mental illness Neg Hx   . Mental retardation Neg Hx   . Miscarriages / Stillbirths Neg Hx   . Stroke Neg Hx   . Vision loss Neg Hx   . Varicose Veins Neg Hx     Social History:  reports that she has been smoking Cigarettes.  She has never used smokeless tobacco. She reports that she drinks about 0.6 - 1.2 oz of alcohol per week . She reports that she does not use drugs.  Additional Social History:  Alcohol / Drug Use Pain Medications: please see mar Prescriptions: please see mar Over the Counter:  please see mar History of alcohol / drug use?: Yes Longest period of sobriety (when/how long): unknown Negative Consequences of Use: Financial, Legal, Personal relationships, Work / School Substance #1 Name of Substance 1: crack cocaine 1 - Age of First Use: unknown 1 - Amount (size/oz): $500 1 - Frequency: daily 1 - Duration: ongoing 1 - Last Use / Amount: 03/08/17  CIWA: CIWA-Ar BP: 100/64 Pulse Rate: (!) 57 COWS:    PATIENT STRENGTHS: (choose at least two) Average or above average intelligence Capable of independent living  Allergies: No Known Allergies  Home Medications:  (Not in a hospital admission)  OB/GYN Status:  Patient's last menstrual period was 03/08/2017.  General Assessment Data Location of Assessment: [facility] TTS Assessment: In system Is this a Tele or Face-to-Face Assessment?: Face-to-Face Is this an Initial Assessment or a Re-assessment for this encounter?: Initial Assessment Marital status: Single Maiden name: NA Is patient pregnant?: No Pregnancy Status: No Living Arrangements: Alone Can pt return to current living arrangement?: Yes Admission Status: Voluntary Is patient capable of signing voluntary admission?: Yes Referral Source: Self/Family/Friend Insurance type: [employer]     Crisis Care Plan Living  Arrangements: Alone Legal Guardian: Other: (self) Name of Psychiatrist: NA Name of Therapist: NA  Education Status Is patient currently in school?: No Current Grade: NA Highest grade of school patient has completed: 12 Name of school: NA Contact person: NA  Risk to self with the past 6 months Suicidal Ideation: Yes-Currently Present Has patient been a risk to self within the past 6 months prior to admission? : No Suicidal Intent: Yes-Currently Present Has patient had any suicidal intent within the past 6 months prior to admission? : No Is patient at risk for suicide?: Yes Suicidal Plan?: No Has patient had any suicidal plan within the past  6 months prior to admission? : No Access to Means: No What has been your use of drugs/alcohol within the last 12 months?: crack cocaine Previous Attempts/Gestures: Yes How many times?: 3 Other Self Harm Risks: Sa Triggers for Past Attempts: Family contact Intentional Self Injurious Behavior: None Family Suicide History: No Recent stressful life event(s): Loss (Comment) (losing children) Persecutory voices/beliefs?: No Depression: Yes Depression Symptoms: Insomnia, Tearfulness, Isolating, Loss of interest in usual pleasures, Feeling worthless/self pity, Feeling angry/irritable Substance abuse history and/or treatment for substance abuse?: Yes Suicide prevention information given to non-admitted patients: Not applicable  Risk to Others within the past 6 months Homicidal Ideation: No Does patient have any lifetime risk of violence toward others beyond the six months prior to admission? : No Thoughts of Harm to Others: No Current Homicidal Intent: No Current Homicidal Plan: No Access to Homicidal Means: No Identified Victim: NA History of harm to others?: No Assessment of Violence: None Noted Violent Behavior Description: NA Does patient have access to weapons?: No Criminal Charges Pending?: No Does patient have a court date: No Is patient on probation?: No  Psychosis Hallucinations: None noted Delusions: None noted  Mental Status Report Appearance/Hygiene: Unremarkable Eye Contact: Fair Motor Activity: Freedom of movement Speech: Logical/coherent Level of Consciousness: Alert Mood: Depressed Affect: Depressed Anxiety Level: Minimal Thought Processes: Coherent, Relevant Judgement: Unimpaired Orientation: Person, Place, Time, Situation Obsessive Compulsive Thoughts/Behaviors: None  Cognitive Functioning Concentration: Normal Memory: Recent Intact, Remote Intact IQ: Average Insight: Fair Impulse Control: Fair Appetite: Fair Weight Loss: 0 Weight Gain: 0 Sleep:  Decreased Total Hours of Sleep: 5 Vegetative Symptoms: None  ADLScreening [facility]) Patient's cognitive ability adequate to safely complete daily activities?: Yes Patient able to express need for assistance with ADLs?: Yes Independently performs ADLs?: Yes (appropriate for developmental age)  Prior Inpatient Therapy Prior Inpatient Therapy: Yes Prior Therapy Dates: unknown Prior Therapy Facilty/Provider(s): unknown Reason for Treatment: SI  Prior Outpatient Therapy Prior Outpatient Therapy: No Prior Therapy Dates: NA Prior Therapy Facilty/Provider(s): NA Reason for Treatment: na Does patient have an ACCT team?: No Does patient have Intensive In-House Services?  : No Does patient have [facility] services? : No Does patient have P4[facility] services?: No  ADL Screening (condition at time of admission) Patient's cognitive ability adequate to safely complete daily activities?: Yes Is the patient deaf or have difficulty hearing?: No Does the patient have difficulty seeing, even when wearing glasses/contacts?: No Does the patient have difficulty concentrating, remembering, or making decisions?: No Patient able to express need for assistance with ADLs?: Yes Does the patient have difficulty dressing or bathing?: No Independently performs ADLs?: Yes (appropriate for developmental age) Does the patient have difficulty walking or climbing stairs?: No Weakness of Legs: None Weakness of Arms/Hands: None       Abuse/Neglect Assessment (Assessment to be complete while patient is alone) Physical  Abuse: Denies Verbal Abuse: Denies Sexual Abuse: Denies Exploitation of patient/patient's resources: Denies Self-Neglect: Denies     Merchant navy officer (For Healthcare) Does Patient Have a Medical Advance Directive?: No    Additional Information 1:1 In Past 12 Months?: No CIRT Risk: No Elopement Risk: No Does patient have medical clearance?: Yes     Disposition:   Disposition Initial Assessment Completed for this Encounter: Yes  Lynzi Meulemans D 03/08/2017 9:30 AM

## 2017-03-08 NOTE — ED Notes (Addendum)
Assumed care of patient, patient calm and cooperative. Pt given po fluids. Pt states she has not slept and wishes to sleep since being up all night.

## 2017-03-08 NOTE — BH Assessment (Signed)
Received call from Pt's RN saying Pt is upset and wants to be discharged. Spoke with Pt and she expressed frustration that she has been in the ED all day and isn't receiving mental health treatment. Pt insists that she wants to leave. [facility] and [facility] is looking for placement. Explained differences in outpatient treatment and inpatient treatment. This TTS counselor asked Pt several times if she was still experiencing suicidal ideation and she avoided answering the question. She said that there are cords in the room with which she could harm herself. Explained to Pt she cannot be discharged without speaking to the EDP and explained involuntary commitment. Spoke to Dr. Deniece PortelaPedro E. Cardama and prepared IVC paperwork if he chose to pursue IVC. Informed Pt's RN of status.   Harlin RainFord Ellis Patsy BaltimoreWarrick Jr, LPC, [facility], [facility] Triage Specialist [phone]

## 2017-03-08 NOTE — ED Notes (Signed)
Pt crying, sitter arrived with patient. Pt states she is feeling anxious and thinking about her kids.

## 2017-03-08 NOTE — ED Provider Notes (Signed)
Patient is medically cleared. She can be transferred to psych for continued evaluation of suicidal ideation   Bethann BerkshireZammit, Damoni Causby, MD 03/08/17 1225

## 2017-03-08 NOTE — ED Notes (Signed)
Patient reports chest pain since awakening at 1730. EKG-NSR. Patient in no acute distress.

## 2017-03-08 NOTE — ED Notes (Signed)
TTS in the room with the patient. 

## 2017-03-08 NOTE — ED Notes (Signed)
Sitter assisted the patient with a bedside bath and change of scrubs.

## 2017-03-08 NOTE — ED Notes (Signed)
Person to call:  Laymond PurserBonnie Harris [phone]

## 2017-03-08 NOTE — ED Notes (Signed)
SBAR Report received from previous nurse. Pt received calm and visible on unit. Pt denies current SI/ HI, A/V H,  or pain at this time, and appears otherwise stable and free of distress but endorses anxiety and depression at this time. Pt has requested medication for sleep. Pt reminded of camera surveillance, q 15 min rounds, and rules of the milieu. Will continue to assess.

## 2017-03-08 NOTE — ED Notes (Signed)
[id number] Expected date:  Expected time:  Means of arrival:  Comments: Andrey CampanileWilson

## 2017-03-08 NOTE — ED Notes (Signed)
EKG given to Dr. Cardama  

## 2017-03-08 NOTE — ED Notes (Signed)
Pt is crying, stating that we are useless and unable to help her, pt reassured and told they need to wait until they have a room.

## 2017-03-09 DIAGNOSIS — G4700 Insomnia, unspecified: Secondary | ICD-10-CM | POA: Diagnosis not present

## 2017-03-09 DIAGNOSIS — F1414 Cocaine abuse with cocaine-induced mood disorder: Secondary | ICD-10-CM | POA: Diagnosis not present

## 2017-03-09 DIAGNOSIS — F17210 Nicotine dependence, cigarettes, uncomplicated: Secondary | ICD-10-CM | POA: Diagnosis not present

## 2017-03-09 DIAGNOSIS — G43909 Migraine, unspecified, not intractable, without status migrainosus: Secondary | ICD-10-CM | POA: Diagnosis not present

## 2017-03-09 NOTE — ED Notes (Signed)
Pt resting quietly, calmer.  Pt is aware that she is going to be dc'd.  Pt encouraged to contact her psychiatrist tomorrow [facility]

## 2017-03-09 NOTE — ED Notes (Signed)
On the phone, pt unable to get a ride home will give bus pass.

## 2017-03-09 NOTE — Consult Note (Signed)
[facility] Consult   Reason for Consult:  Cocaine abuse with suicidal ideations Referring Physician:  EDP Patient Identification: Jenna Wilson MRN:  [id number] Principal Diagnosis: Cocaine abuse with cocaine-induced mood disorder [facility]) Diagnosis:   Patient Active Problem List   Diagnosis Date Noted  . Cocaine abuse with cocaine-induced mood disorder [facility]) [F14.14] 03/09/2017    Priority: High  . Chronic post-traumatic stress disorder (PTSD) [F43.12] 11/27/2016  . 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 GAD (generalized anxiety disorder) [F41.1] 05/06/2016  . Post partum depression [F53] 05/05/2014  . NVD (normal vaginal delivery) [O80] 03/22/2014  . Other specified indication for care or intervention related to labor and delivery, unspecified as to episode of care [O75.9] 03/21/2014    Total Time spent with patient: 45 minutes  Subjective:   Jenna Wilson is a 26 y.o. female patient does not warrant admission.  HPI:  26 yo female who presented to the ED after abuse cocaine and having passive suicidal ideations.  Evidently, she reports she has been on a binge for the past five days and when she went to see her children yesterday they would not allow her to see them.  This upset her and she came to the ED.  On assessment today, she is irritable with no suicidal/homicidal ideations, hallucinations, or withdrawal symptoms.  Patient at [facility] outpatient.  STable for discharge as this is what she requested and does not meet criteria for admission.  Past Psychiatric History: substance abuse, PTSD, anxiety  Risk to Self: Suicidal Ideation: Currently denied Suicidal Intent: Currently denies Is patient at risk for suicide?: denies Suicidal Plan?: No Access to Means: No What has been your use of drugs/alcohol within the last 12 months?: crack cocaine How many times?: 3 Other Self Harm Risks: Sa Triggers for Past Attempts: Family contact Intentional Self  Injurious Behavior: None Risk to Others: Homicidal Ideation: No Thoughts of Harm to Others: No Current Homicidal Intent: No Current Homicidal Plan: No Access to Homicidal Means: No Identified Victim: NA History of harm to others?: No Assessment of Violence: None Noted Violent Behavior Description: NA Does patient have access to weapons?: No Criminal Charges Pending?: No Does patient have a court date: No Prior Inpatient Therapy: Prior Inpatient Therapy: Yes Prior Therapy Dates: unknown Prior Therapy Facilty/Provider(s): unknown Reason for Treatment: SI Prior Outpatient Therapy: Prior Outpatient Therapy: No Prior Therapy Dates: NA Prior Therapy Facilty/Provider(s): NA Reason for Treatment: na Does patient have an ACCT team?: No Does patient have Intensive In-House Services?  : No Does patient have [facility] services? : No Does patient have [facility] services?: No  Past Medical History:  Past Medical History:  Diagnosis Date  . Anxiety   . Depression   . Hx of drug abuse    past heroin addict    Past Surgical History:  Procedure Laterality Date  . NO PAST SURGERIES     Family History:  Family History  Problem Relation Age of Onset  . Alcohol abuse Neg Hx   . Arthritis Neg Hx   . Asthma Neg Hx   . Birth defects Neg Hx   . Cancer Neg Hx   . COPD Neg Hx   . Depression Neg Hx   . Diabetes Neg Hx   . Drug abuse Neg Hx   . Early death Neg Hx   . Hearing loss Neg Hx   . Heart disease Neg Hx   . Hyperlipidemia Neg Hx   . Hypertension Neg Hx   .  Kidney disease Neg Hx   . Learning disabilities Neg Hx   . Mental illness Neg Hx   . Mental retardation Neg Hx   . Miscarriages / Stillbirths Neg Hx   . Stroke Neg Hx   . Vision loss Neg Hx   . Varicose Veins Neg Hx    Family Psychiatric  History: unknown Social History:  History  Alcohol Use  . 0.6 - 1.2 oz/week  . 1 - 2 Glasses of wine per week    Comment: every 2 -3 weeks     History  Drug Use No    Comment: stopped  several years ago    Social History   Social History  . Marital status: Married    Spouse name: N/A  . Number of children: N/A  . Years of education: N/A   Social History Main Topics  . Smoking status: Current Every Day Smoker    Types: Cigarettes  . Smokeless tobacco: Never Used  . Alcohol use 0.6 - 1.2 oz/week    1 - 2 Glasses of wine per week     Comment: every 2 -3 weeks  . Drug use: No     Comment: stopped several years ago  . Sexual activity: Yes    Birth control/ protection: None, Condom   Other Topics Concern  . None   Social History Narrative  . None   Additional Social History:    Allergies:  Not on File  Labs:  Results for orders placed or performed during the hospital encounter of 03/07/17 (from the past 48 hour(s))  Comprehensive metabolic panel     Status: Abnormal   Collection Time: 03/07/17 11:31 PM  Result Value Ref Range   Sodium 141 135 - 145 mmol/L   Potassium 3.7 3.5 - 5.1 mmol/L   Chloride 108 101 - 111 mmol/L   CO2 26 22 - 32 mmol/L   Glucose, Bld 105 (H) 65 - 99 mg/dL   BUN 12 6 - 20 mg/dL   Creatinine, Ser 1.29 (H) 0.44 - 1.00 mg/dL   Calcium 9.0 8.9 - 10.3 mg/dL   Total Protein 7.8 6.5 - 8.1 g/dL   Albumin 4.1 3.5 - 5.0 g/dL   AST 26 15 - 41 U/L   ALT 20 14 - 54 U/L   Alkaline Phosphatase 90 38 - 126 U/L   Total Bilirubin 0.4 0.3 - 1.2 mg/dL   GFR calc non Af Amer 56 (L) >60 mL/min   GFR calc Af Amer >60 >60 mL/min    Comment: (NOTE) The eGFR has been calculated using the CKD EPI equation. This calculation has not been validated in all clinical situations. eGFR's persistently <60 mL/min signify possible Chronic Kidney Disease.    Anion gap 7 5 - 15  Ethanol     Status: None   Collection Time: 03/07/17 11:31 PM  Result Value Ref Range   Alcohol, Ethyl (B) <5 <5 mg/dL    Comment:        LOWEST DETECTABLE LIMIT FOR SERUM ALCOHOL IS 5 mg/dL FOR MEDICAL PURPOSES ONLY   Salicylate level     Status: None   Collection Time:  03/07/17 11:31 PM  Result Value Ref Range   Salicylate Lvl <7.7 2.8 - 30.0 mg/dL  Acetaminophen level     Status: Abnormal   Collection Time: 03/07/17 11:31 PM  Result Value Ref Range   Acetaminophen (Tylenol), Serum <10 (L) 10 - 30 ug/mL    Comment:  THERAPEUTIC CONCENTRATIONS VARY SIGNIFICANTLY. A RANGE OF 10-30 ug/mL MAY BE AN EFFECTIVE CONCENTRATION FOR MANY PATIENTS. HOWEVER, SOME ARE BEST TREATED AT CONCENTRATIONS OUTSIDE THIS RANGE. ACETAMINOPHEN CONCENTRATIONS >150 ug/mL AT 4 HOURS AFTER INGESTION AND >50 ug/mL AT 12 HOURS AFTER INGESTION ARE OFTEN ASSOCIATED WITH TOXIC REACTIONS.   cbc     Status: Abnormal   Collection Time: 03/07/17 11:31 PM  Result Value Ref Range   WBC 10.0 4.0 - 10.5 K/uL   RBC 5.01 3.87 - 5.11 MIL/uL   Hemoglobin 15.8 (H) 12.0 - 15.0 g/dL   HCT 44.8 36.0 - 46.0 %   MCV 89.4 78.0 - 100.0 fL   MCH 31.5 26.0 - 34.0 pg   MCHC 35.3 30.0 - 36.0 g/dL   RDW 13.0 11.5 - 15.5 %   Platelets 351 150 - 400 K/uL  Rapid urine drug screen (hospital performed)     Status: Abnormal   Collection Time: 03/07/17 11:32 PM  Result Value Ref Range   Opiates NONE DETECTED NONE DETECTED   Cocaine POSITIVE (A) NONE DETECTED   Benzodiazepines NONE DETECTED NONE DETECTED   Amphetamines NONE DETECTED NONE DETECTED   Tetrahydrocannabinol POSITIVE (A) NONE DETECTED   Barbiturates NONE DETECTED NONE DETECTED    Comment:        DRUG SCREEN FOR MEDICAL PURPOSES ONLY.  IF CONFIRMATION IS NEEDED FOR ANY PURPOSE, NOTIFY LAB WITHIN 5 DAYS.        LOWEST DETECTABLE LIMITS FOR URINE DRUG SCREEN Drug Class       Cutoff (ng/mL) Amphetamine      1000 Barbiturate      200 Benzodiazepine   448 Tricyclics       185 Opiates          300 Cocaine          300 THC              50   I-Stat beta hCG blood, ED     Status: None   Collection Time: 03/07/17 11:46 PM  Result Value Ref Range   I-stat hCG, quantitative <5.0 <5 mIU/mL   Comment 3            Comment:   GEST.  AGE      CONC.  (mIU/mL)   <=1 WEEK        5 - 50     2 WEEKS       50 - 500     3 WEEKS       100 - 10,000     4 WEEKS     1,000 - 30,000        FEMALE AND NON-PREGNANT FEMALE:     LESS THAN 5 mIU/mL   Troponin I     Status: None   Collection Time: 03/08/17  7:11 AM  Result Value Ref Range   Troponin I <0.03 <0.03 ng/mL  I-Stat beta hCG blood, ED     Status: None   Collection Time: 03/08/17  7:20 AM  Result Value Ref Range   I-stat hCG, quantitative <5.0 <5 mIU/mL   Comment 3            Comment:   GEST. AGE      CONC.  (mIU/mL)   <=1 WEEK        5 - 50     2 WEEKS       50 - 500     3 WEEKS       100 -  10,000     4 WEEKS     1,000 - 30,000        FEMALE AND NON-PREGNANT FEMALE:     LESS THAN 5 mIU/mL   Troponin I     Status: None   Collection Time: 03/08/17 11:22 AM  Result Value Ref Range   Troponin I <0.03 <0.03 ng/mL    Current Facility-Administered Medications  Medication Dose Route Frequency Provider Last Rate Last Dose  . hydrOXYzine (ATARAX/VISTARIL) tablet 25 mg  25 mg Oral TID PRN Derwood Kaplan, MD   25 mg at 03/09/17 0910  . ibuprofen (ADVIL,MOTRIN) tablet 600 mg  600 mg Oral Q8H PRN Derwood Kaplan, MD   600 mg at 03/09/17 0909  . ondansetron (ZOFRAN) tablet 4 mg  4 mg Oral Q8H PRN Nanavati, Ankit, MD      . prazosin (MINIPRESS) capsule 2 mg  2 mg Oral QHS Nanavati, Ankit, MD   2 mg at 03/08/17 2207  . sertraline (ZOLOFT) tablet 100 mg  100 mg Oral Daily Derwood Kaplan, MD   100 mg at 03/09/17 0909   Current Outpatient Prescriptions  Medication Sig Dispense Refill  . hydrOXYzine (VISTARIL) 25 MG capsule Take 1 capsule (25 mg total) by mouth 3 (three) times daily as needed for anxiety. 30 capsule 0  . prazosin (MINIPRESS) 2 MG capsule Take 1 capsule (2 mg total) by mouth at bedtime. 30 capsule 2  . sertraline (ZOLOFT) 100 MG tablet Take 1 tablet (100 mg total) by mouth daily. Take 1/2 tablet for 1 week, then increase to the whole tablet (Patient taking  differently: Take 100 mg by mouth daily. ) 30 tablet 2    Musculoskeletal: Strength & Muscle Tone: within normal limits Gait & Station: normal Patient leans: N/A  Psychiatric Specialty Exam: Physical Exam  Constitutional: She is oriented to person, place, and time. She appears well-developed and well-nourished.  HENT:  Head: Normocephalic.  Neck: Normal range of motion.  Respiratory: Effort normal.  Musculoskeletal: Normal range of motion.  Neurological: She is alert and oriented to person, place, and time.  Psychiatric: Her speech is normal and behavior is normal. Judgment and thought content normal. Cognition and memory are normal. She exhibits a depressed mood.    Review of Systems  Constitutional: Negative.   HENT: Negative.   Eyes: Negative.   Respiratory: Negative.   Gastrointestinal: Negative.   Genitourinary: Negative.   Musculoskeletal: Negative.   Skin: Negative.   Neurological: Negative.   Endo/Heme/Allergies: Negative.   Psychiatric/Behavioral: Positive for depression and substance abuse.    Blood pressure 96/61, pulse 68, temperature 98.4 F (36.9 C), temperature source Oral, resp. rate 16, height 5\' 2"  (1.575 m), weight 68 kg (150 lb), last menstrual period 03/08/2017, SpO2 100 %, unknown if currently breastfeeding.Body mass index is 27.44 kg/m.  General Appearance: Casual  Eye Contact:  Good  Speech:  Normal Rate  Volume:  Normal  Mood:  Depressed and Irritable  Affect:  Congruent  Thought Process:  Coherent and Descriptions of Associations: Intact  Orientation:  Full (Time, Place, and Person)  Thought Content:  WDL and Logical  Suicidal Thoughts:  No  Homicidal Thoughts:  No  Memory:  Immediate;   Good Recent;   Good Remote;   Good  Judgement:  Fair  Insight:  Fair  Psychomotor Activity:  Normal  Concentration:  Concentration: Good and Attention Span: Good  Recall:  Good  Fund of Knowledge:  Fair  Language:  Good  Akathisia:  No  Handed:  Right   AIMS (if indicated):     Assets:  Housing Leisure Time Physical Health Resilience Social Support  ADL's:  Intact  Cognition:  WNL  Sleep:        Treatment Plan Summary: Daily contact with patient to assess and evaluate symptoms and progress in treatment, Medication management and Plan cocaine abuse with cocaine induced mood disorder:  -Crisis stabilization -Medication management:  Continued Zoloft 100 mg daily for depression, Minipress 2 mg at bedtime for nightmares, and Vistaril 25 mg TID PRN anxiety -Individual and substance abuse counseling  Disposition: No evidence of imminent risk to self or others at present.    Waylan Boga, NP 03/09/2017 11:07 AM  Patient seen face-to-face for psychiatric evaluation, chart reviewed and case discussed with the physician extender and developed treatment plan. Reviewed the information documented and agree with the treatment plan. Corena Pilgrim, MD

## 2017-03-09 NOTE — ED Notes (Signed)
No answer at number listed for mother

## 2017-03-09 NOTE — ED Notes (Signed)
Pt calmer, still wanting to leave.  Pt denies si/hi at this time.

## 2017-03-09 NOTE — ED Notes (Signed)
Pt denies si/hi/avh at this time. Written dc instructions reviewed with pt.  Pt encouraged to take her medications as directed and [facility]  schedule [facility].  Pt also given information for [facility] and contact information for mobile crisis.  Pt instructed to seek treatment for any suicidal thoughts or urges.  Pt verbalized understanding.

## 2017-03-09 NOTE — BH Assessment (Signed)
[facility] Assessment Progress Note  Per Thedore MinsMojeed Akintayo, MD, this pt does not require psychiatric hospitalization at this time.  [facility]; she does not currently have an appointment.  Discharge instructions advise pt to contact them to schedule an appointment at her earliest opportunity.  Pt's nurse, Wille CelesteJanie, has been notified.  Doylene Canninghomas Elen Acero, MA Triage Specialist [phone]

## 2017-03-09 NOTE — Discharge Instructions (Signed)
For your ongoing behavioral health needs, you are advised to continue treatment at [facility].  Contact them at your earliest opportunity to schedule an appointment:       [facility]      [address]      [place], [place] [address]      [phone]
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
## 2017-03-09 NOTE — ED Notes (Signed)
On the phone 

## 2017-03-09 NOTE — ED Notes (Signed)
Pt sitting quietly in room, calmer.  Pt is aware that she is going to be dc'd and request this writer contact her mother to confirm with her she is leaving and request a ride  Verbal permission to tell mother she is being dc's from patient.

## 2017-03-09 NOTE — ED Notes (Signed)
Pt ambulatory w/o difficulty w/ mHt to waiting room to wait for ride.  Belongings returned after leaving the area.

## 2017-03-09 NOTE — ED Notes (Signed)
On the  Phone trying to get ride home

## 2017-03-09 NOTE — ED Notes (Addendum)
On the phone, pt has been able to find a ride home.

## 2017-03-21 NOTE — ED Provider Notes (Signed)
[facility]-EMERGENCY DEPT Provider Note   CSN: [id number] Arrival date & time: 03/07/17  2254     History   Chief Complaint Chief Complaint  Patient presents with  . Suicidal    HPI Jenna Wilson is a 26 y.o. female.  HPI  Pt comes in with cc of SI. Pt reports that she has been depressed for a long while and not taking her meds. Over the past few days she has been binging on cocaine and she finally decided to come to the ER seeking help. Pt is having some chest pain right now. She has no CAD hx. Pt denies nausea, emesis, fevers, chills, shortness of breath, headaches, abdominal pain, uti like symptoms.   Past Medical History:  Diagnosis Date  . Anxiety   . Depression   . Hx of drug abuse    past heroin addict    Patient Active Problem List   Diagnosis Date Noted  . Cocaine abuse with cocaine-induced mood disorder (HCC) 03/09/2017  . Chronic post-traumatic stress disorder (PTSD) 11/27/2016  . Insomnia due to anxiety and fear 11/27/2016  . GAD (generalized anxiety disorder) 05/06/2016  . Post partum depression 05/05/2014  . NVD (normal vaginal delivery) 03/22/2014  . Other specified indication for care or intervention related to labor and delivery, unspecified as to episode of care 03/21/2014    Past Surgical History:  Procedure Laterality Date  . NO PAST SURGERIES      OB History    Gravida Para Term Preterm AB Living   2 2 2     2    SAB TAB Ectopic Multiple Live Births           2       Home Medications    Prior to Admission medications   Medication Sig Start Date End Date Taking? Authorizing Provider  hydrOXYzine (VISTARIL) 25 MG capsule Take 1 capsule (25 mg total) by mouth 3 (three) times daily as needed for anxiety. 11/27/16  Yes Eksir, Bo Mcclintock, MD  prazosin (MINIPRESS) 2 MG capsule Take 1 capsule (2 mg total) by mouth at bedtime. 11/27/16  Yes Eksir, Bo Mcclintock, MD  sertraline (ZOLOFT) 100 MG tablet Take 1 tablet (100 mg total) by mouth daily.  Take 1/2 tablet for 1 week, then increase to the whole tablet Patient taking differently: Take 100 mg by mouth daily.  11/27/16 11/27/17 Yes Eksir, Bo Mcclintock, MD    Family History Family History  Problem Relation Age of Onset  . Alcohol abuse Neg Hx   . Arthritis Neg Hx   . Asthma Neg Hx   . Birth defects Neg Hx   . Cancer Neg Hx   . COPD Neg Hx   . Depression Neg Hx   . Diabetes Neg Hx   . Drug abuse Neg Hx   . Early death Neg Hx   . Hearing loss Neg Hx   . Heart disease Neg Hx   . Hyperlipidemia Neg Hx   . Hypertension Neg Hx   . Kidney disease Neg Hx   . Learning disabilities Neg Hx   . Mental illness Neg Hx   . Mental retardation Neg Hx   . Miscarriages / Stillbirths Neg Hx   . Stroke Neg Hx   . Vision loss Neg Hx   . Varicose Veins Neg Hx     Social History Social History  Substance Use Topics  . Smoking status: Current Every Day Smoker    Types: Cigarettes  . Smokeless tobacco: Never Used  .  Alcohol use 0.6 - 1.2 oz/week    1 - 2 Glasses of wine per week     Comment: every 2 -3 weeks     Allergies   Patient has no allergy information on record.   Review of Systems Review of Systems  Constitutional: Positive for activity change.  Respiratory: Negative for shortness of breath.   Cardiovascular: Positive for chest pain.  Gastrointestinal: Negative for abdominal pain, nausea and vomiting.  Genitourinary: Negative for dysuria.  Musculoskeletal: Negative for neck pain.  Neurological: Negative for headaches.  Psychiatric/Behavioral: Positive for suicidal ideas.     Physical Exam Updated Vital Signs BP 97/61 (BP Location: Left Arm)   Pulse 78   Temp 98.1 F (36.7 C) (Oral)   Resp 20   Ht 5\' 2"  (1.575 m)   Wt 68 kg (150 lb)   LMP 03/08/2017   SpO2 95%   BMI 27.44 kg/m   Physical Exam  Constitutional: She is oriented to person, place, and time. She appears well-developed and well-nourished.  HENT:  Head: Normocephalic and atraumatic.  Eyes:  EOM are normal. Pupils are equal, round, and reactive to light.  Neck: Neck supple.  Cardiovascular: Normal rate, regular rhythm, normal heart sounds and intact distal pulses.   No murmur heard. Pulmonary/Chest: Effort normal. No respiratory distress.  Abdominal: Soft. She exhibits no distension. There is no tenderness. There is no rebound and no guarding.  Neurological: She is alert and oriented to person, place, and time.  Skin: Skin is warm and dry.  Nursing note and vitals reviewed.    ED Treatments / Results  Labs (all labs ordered are listed, but only abnormal results are displayed) Labs Reviewed  COMPREHENSIVE METABOLIC PANEL - Abnormal; Notable for the following:       Result Value   Glucose, Bld 105 (*)    Creatinine, Ser 1.29 (*)    GFR calc non Af Amer 56 (*)    All other components within normal limits  ACETAMINOPHEN LEVEL - Abnormal; Notable for the following:    Acetaminophen (Tylenol), Serum <10 (*)    All other components within normal limits  CBC - Abnormal; Notable for the following:    Hemoglobin 15.8 (*)    All other components within normal limits  RAPID URINE DRUG SCREEN, HOSP PERFORMED - Abnormal; Notable for the following:    Cocaine POSITIVE (*)    Tetrahydrocannabinol POSITIVE (*)    All other components within normal limits  ETHANOL  SALICYLATE LEVEL  TROPONIN I  TROPONIN I  I-STAT BETA HCG BLOOD, ED ([facility], [facility], [facility] ONLY)  I-STAT BETA HCG BLOOD, ED ([facility], [facility], [facility] ONLY)    EKG  EKG Interpretation  Date/Time:  Sunday Mar 08 2017 17:56:41 EDT Ventricular Rate:  65 PR Interval:    QRS Duration: 96 QT Interval:  417 QTC Calculation: 434 R Axis:   71 Text Interpretation:  Sinus rhythm Confirmed by Rochele Raring [id number]) on 03/09/2017 4:03:18 PM       Radiology No results found.  Procedures Procedures (including critical care time)  Medications Ordered in ED Medications  LORazepam (ATIVAN) injection 1 mg (1 mg Intravenous Given 03/08/17 0814)    aspirin chewable tablet 324 mg (324 mg Oral Given 03/08/17 0854)  LORazepam (ATIVAN) injection 2 mg (2 mg Intravenous Given 03/08/17 2016)     Initial Impression / Assessment and Plan / ED Course  I have reviewed the triage vital signs and the nursing notes.  Pertinent labs & imaging results  that were available during my care of the patient were reviewed by me and considered in my medical decision making (see chart for details).     Pt comes in with cc of SI. She also appears depressed and has been binging on cocaine the last few days. She has been having some chest pain - trops x 2 ordered. EKG is reassuring. PT placed on tele for now. If trops x 2 stay neg, pt will be medically cleared.  Final Clinical Impressions(s) / ED Diagnoses   Final diagnoses:  Cocaine abuse with cocaine-induced mood disorder [facility](HCC)    New Prescriptions Discharge Medication List as of 03/09/2017 11:37 AM       Derwood KaplanNanavati, Roseline Ebarb, MD 03/21/17 [phone]

## 2017-04-08 ENCOUNTER — Telehealth (HOSPITAL_COMMUNITY): Payer: Self-pay

## 2017-04-08 NOTE — Telephone Encounter (Signed)
She should coordinate with their social work for new provider care. She was terminated from our clinic due to no shows

## 2017-04-08 NOTE — Telephone Encounter (Signed)
Patient is calling from [facility] where she is inpatient, she is looking for an appointment sooner that 7/27. I advised patient that I would put her on the cancellation list but that you have no sooner appointments. I also talked with her about the 2 no shows she had. I told patient I would reach out to you and see if you could fit her in somewhere. Please review and advise, thank you

## 2017-04-08 NOTE — Telephone Encounter (Signed)
I called and s/w Randy in admissions - I let him know that the patient would need to coordinate with the social worker there to get set up with a new doctor.

## 2017-05-08 ENCOUNTER — Encounter (HOSPITAL_COMMUNITY): Payer: Self-pay | Admitting: Psychiatry

## 2017-05-08 ENCOUNTER — Other Ambulatory Visit (HOSPITAL_COMMUNITY): Payer: Self-pay

## 2017-05-08 ENCOUNTER — Ambulatory Visit (INDEPENDENT_AMBULATORY_CARE_PROVIDER_SITE_OTHER): Payer: 59 | Admitting: Psychiatry

## 2017-05-08 ENCOUNTER — Telehealth (HOSPITAL_COMMUNITY): Payer: Self-pay | Admitting: Psychiatry

## 2017-05-08 ENCOUNTER — Other Ambulatory Visit (HOSPITAL_COMMUNITY): Payer: Self-pay | Admitting: Psychiatry

## 2017-05-08 VITALS — BP 108/78 | HR 83 | Ht 62.25 in | Wt 167.0 lb

## 2017-05-08 DIAGNOSIS — F3162 Bipolar disorder, current episode mixed, moderate: Secondary | ICD-10-CM

## 2017-05-08 DIAGNOSIS — Z818 Family history of other mental and behavioral disorders: Secondary | ICD-10-CM | POA: Diagnosis not present

## 2017-05-08 DIAGNOSIS — F4312 Post-traumatic stress disorder, chronic: Secondary | ICD-10-CM | POA: Diagnosis not present

## 2017-05-08 DIAGNOSIS — F17210 Nicotine dependence, cigarettes, uncomplicated: Secondary | ICD-10-CM

## 2017-05-08 DIAGNOSIS — F1414 Cocaine abuse with cocaine-induced mood disorder: Secondary | ICD-10-CM | POA: Diagnosis not present

## 2017-05-08 DIAGNOSIS — Z811 Family history of alcohol abuse and dependence: Secondary | ICD-10-CM | POA: Diagnosis not present

## 2017-05-08 MED ORDER — PRAZOSIN HCL 2 MG PO CAPS: 2.0000 mg | ORAL_CAPSULE | Freq: Two times a day (BID) | ORAL | 3 refills | Status: DC

## 2017-05-08 MED ORDER — LITHIUM CARBONATE ER 300 MG PO TBCR: 300.0000 mg | EXTENDED_RELEASE_TABLET | Freq: Every day | ORAL | 3 refills | Status: DC

## 2017-05-08 MED ORDER — METFORMIN HCL 500 MG PO TABS: 500.0000 mg | ORAL_TABLET | Freq: Every day | ORAL | 3 refills | Status: DC

## 2017-05-08 MED ORDER — CYCLOBENZAPRINE HCL 5 MG PO TABS: 10.0000 mg | ORAL_TABLET | Freq: Every day | ORAL | 3 refills | Status: DC

## 2017-05-08 MED ORDER — QUETIAPINE FUMARATE 200 MG PO TABS: 200.0000 mg | ORAL_TABLET | Freq: Every day | ORAL | 3 refills | Status: DC

## 2017-05-08 MED ORDER — BUSPIRONE HCL 10 MG PO TABS: 10.0000 mg | ORAL_TABLET | Freq: Three times a day (TID) | ORAL | 3 refills | Status: DC

## 2017-05-08 NOTE — Progress Notes (Signed)
[facility] MD/PA/NP OP Progress Note  05/08/2017 9:07 AM Jenna Wilson  MRN:  [id number]  Chief Complaint:  Chief Complaint    Follow-up     follow-up after initial intake, hospital discharge  Subjective: Jenna Wilson presents today after hospitalization. She was seen by this writer once in February 2018, and she had subsequent no-shows later in February, and in March, and no further follow-up visit scheduled.  She was discharged from this clinic, due to repeat no-shows. She had an emergency room visit 2 months ago, in May 2018, after ongoing binge use of cocaine and subsequent suicidality.  I spent time with the patient reviewing the past several months, and learning about her ongoing binges of cocaine. She shares her ongoing grief that her relationship with her children has been quite limited, due to her behaviors and substance abuse. She reports that she's had significant up-and-down mood lability, and reviewing the timeline, it does seem like she would have significant periods of elevated mood subsequent to cocaine use. She was diagnosed with bipolar disorder at [facility], which she has read about and ultimately agrees that this captures her experience.  She has done better with lithium, Seroquel, BuSpar, prazosin, and Flexeril. She reports that she has not used any drugs or alcohol in the past 3 weeks since she was discharged. She attends Narcotics Anonymous every single day. [place] and was just recently hired at the [employer] for an evening position.  She is agreeable to participating in the IOP at this clinic, and addition to individual therapy thereafter. We discussed getting laboratory studies today to monitor her lithium. She reports that the prazosin really helps her in the evening with her hypervigilance and anxiety around others, and we discussed adding a morning dose of prazosin as well. We also agreed to continue monitoring her weight gain from  Seroquel, and add metformin to curb this weight gain. Ultimately if she continues to gain substantial weight we will switch to an alternative agent.    She denies any suicidality, and reports that she is quite motivated to work on her sobriety and maintain a positive trajectory so that she can see her kids. She was tearful in describing how painfully she misses them, and reports that her mom and stepdad are being a good support for her to be able to regain some visitation rights with them.  Visit Diagnosis:    ICD-10-CM   1. Chronic post-traumatic stress disorder (PTSD) F43.12 QUEtiapine (SEROQUEL) 200 MG tablet    prazosin (MINIPRESS) 2 MG capsule    lithium carbonate (LITHOBID) 300 MG CR tablet    cyclobenzaprine (FLEXERIL) 5 MG tablet    metFORMIN (GLUCOPHAGE) 500 MG tablet    busPIRone (BUSPAR) 10 MG tablet  2. Cocaine abuse with cocaine-induced mood disorder (HCC) F14.14 QUEtiapine (SEROQUEL) 200 MG tablet    lithium carbonate (LITHOBID) 300 MG CR tablet    cyclobenzaprine (FLEXERIL) 5 MG tablet    busPIRone (BUSPAR) 10 MG tablet  3. Moderate mixed bipolar I disorder (HCC) F31.62 QUEtiapine (SEROQUEL) 200 MG tablet    prazosin (MINIPRESS) 2 MG capsule    lithium carbonate (LITHOBID) 300 MG CR tablet    cyclobenzaprine (FLEXERIL) 5 MG tablet    metFORMIN (GLUCOPHAGE) 500 MG tablet    busPIRone (BUSPAR) 10 MG tablet    Past Psychiatric History: See intake H&P for full details. Reviewed, with no updates at this time.   Past Medical History:  Past Medical History:  Diagnosis Date  . Anxiety   . Depression   . Hx of drug abuse    past heroin addict    Past Surgical History:  Procedure Laterality Date  . NO PAST SURGERIES      Family Psychiatric History: Recent psychiatric hospitalization at [facility] in [place]. She was hospitalized from June 25 until July 3 and diagnosed with bipolar disorder in the setting of ongoing cocaine and alcohol abuse. She was initiated on Seroquel  200 mg nightly, Flexeril 5 mg nightly, BuSpar 10 mg 3 times daily, and prazosin 2 mg nightly, and lithium 600 mg in the morning and 300 mg at night  Family History:  Family History  Problem Relation Age of Onset  . Alcohol abuse Neg Hx   . Arthritis Neg Hx   . Asthma Neg Hx   . Birth defects Neg Hx   . Cancer Neg Hx   . COPD Neg Hx   . Depression Neg Hx   . Diabetes Neg Hx   . Drug abuse Neg Hx   . Early death Neg Hx   . Hearing loss Neg Hx   . Heart disease Neg Hx   . Hyperlipidemia Neg Hx   . Hypertension Neg Hx   . Kidney disease Neg Hx   . Learning disabilities Neg Hx   . Mental illness Neg Hx   . Mental retardation Neg Hx   . Miscarriages / Stillbirths Neg Hx   . Stroke Neg Hx   . Vision loss Neg Hx   . Varicose Veins Neg Hx     Social History:  Social History   Social History  . Marital status: Married    Spouse name: N/A  . Number of children: N/A  . Years of education: N/A   Social History Main Topics  . Smoking status: Current Every Day Smoker    Packs/day: 1.00    Years: 14.00    Types: Cigarettes  . Smokeless tobacco: Never Used  . Alcohol use No     Comment: None in over a month  . Drug use: No     Comment: stopped several years ago  . Sexual activity: No   Other Topics Concern  . None   Social History Narrative  . None    Allergies: Not on File  Metabolic Disorder Labs: No results found for: HGBA1C, MPG No results found for: PROLACTIN No results found for: CHOL, TRIG, HDL, CHOLHDL, VLDL, LDLCALC   Current Medications: Current Outpatient Prescriptions  Medication Sig Dispense Refill  . busPIRone (BUSPAR) 10 MG tablet Take 1 tablet (10 mg total) by mouth 3 (three) times daily. 90 tablet 3  . cyclobenzaprine (FLEXERIL) 5 MG tablet Take 2 tablets (10 mg total) by mouth at bedtime. 60 tablet 3  . lithium carbonate (LITHOBID) 300 MG CR tablet Take 1 tablet (300 mg total) by mouth daily. Takes 2 tablets by mouth in the morning and 1 tablet by  mouth at bedtime. 90 tablet 3  . prazosin (MINIPRESS) 2 MG capsule Take 1 capsule (2 mg total) by mouth 2 (two) times daily. 60 capsule 3  . QUEtiapine (SEROQUEL) 200 MG tablet Take 1 tablet (200 mg total) by mouth at bedtime. 30 tablet 3  . metFORMIN (GLUCOPHAGE) 500 MG tablet Take 1 tablet (500 mg total) by mouth daily with breakfast. 30 tablet 3   No current facility-administered medications for this visit.     Neurologic: Headache: Negative Seizure: Negative Paresthesias: Negative  Musculoskeletal: Strength &  Muscle Tone: within normal limits Gait & Station: normal Patient leans: N/A  Psychiatric Specialty Exam: ROS  Blood pressure 108/78, pulse 83, height 5' 2.25" (1.581 m), weight 167 lb (75.8 kg), last menstrual period 04/10/2017, SpO2 99 %, unknown if currently breastfeeding.Body mass index is 30.3 kg/m.  General Appearance: Casual and Fairly Groomed  Eye Contact:  Good  Speech:  Clear and Coherent  Volume:  Normal  Mood:  Dysphoric and Euthymic  Affect:  Appropriate, Congruent and Tearful  Thought Process:  Goal Directed  Orientation:  Full (Time, Place, and Person)  Thought Content: Logical   Suicidal Thoughts:  No  Homicidal Thoughts:  No  Memory:  Immediate;   Fair  Judgement:  Fair  Insight:  Fair  Psychomotor Activity:  Normal  Concentration:  Concentration: Good  Recall:  Good  Fund of Knowledge: Good  Language: Good  Akathisia:  Negative  Handed:  Right  AIMS (if indicated):  0  Assets:  Communication Skills Desire for Improvement Financial Resources/Insurance Housing Transportation Vocational/Educational  ADL's:  Intact  Cognition: WNL  Sleep:  7-8 hours    Treatment Plan Summary: Jenna Wilson is a 26 year old female with a psychiatric history consistent with chronic PTSD, bipolar spectrum disorder, and cocaine abuse in early remission. She was psychiatrically hospitalized at [facility] for the entire month of June, and subsequently the early  month of July. She was discharged from Hospital about 3 weeks ago. She attributes much of her struggles over the past few months to her ongoing cocaine binges. She was diagnosed with bipolar disorder at [facility], and given her history of postpartum depression, this does increase her risk of a bipolar affective illness. She ultimately is come to agree with the bipolar diagnosis, and feels that lithium and Seroquel have been effective in reducing her depression and helping with her mood lability.  We will proceed as below and continue her current educations from Hospital discharge, and offer participation in the IOP for additional support. We will follow-up in 6 weeks or sooner if needed.  1. Chronic post-traumatic stress disorder (PTSD)   2. Cocaine abuse with cocaine-induced mood disorder (HCC)   3. Moderate mixed bipolar I disorder (HCC)    Continue lithium 300 mg in the morning and 600 mg nightly Continue Seroquel 200 mg nightly Continue BuSpar 10 mg 3 times daily Continue Flexeril 5-10 mg nightly Continue prazosin 2 mg at night, and okay to take 2 mg during the day if needed Referral to the IOP for additional therapy and support Follow-up with this writer in 6 weeks Continue to encourage participation in the Narcotics Anonymous program Will check lithium level, CMP, TSH, hemoglobin A1c today in office  Burnard Leigh, MD 05/08/2017, 9:07 AM

## 2017-05-08 NOTE — Patient Instructions (Signed)
Continue your current medications with the following modifications   Okay to take prazosin at night AND in the morning  Okay to take 1-2 tablets of flexeril at night (so 5-10 mg)  Okay to take metformin either with breakfast or with dinner

## 2017-05-08 NOTE — Telephone Encounter (Signed)
D:  Dr. Rene KocherEksir referred pt to [facility]-IOP.  A:  Oriented pt.  Provided her with an orientation folder.  Pt wouldn't sign up today, states she would like to contact her insurance company first re: coverage.  Informed Dr. Rene KocherEksir.   Jeri Modenaita Clark, M.Ed, CNA

## 2017-05-12 LAB — COMPREHENSIVE METABOLIC PANEL
A/G RATIO: 1.6 (ref 1.2–2.2)
ALT: 33 IU/L — AB (ref 0–32)
AST: 38 IU/L (ref 0–40)
Albumin: 4.2 g/dL (ref 3.5–5.5)
Alkaline Phosphatase: 95 IU/L (ref 39–117)
BILIRUBIN TOTAL: 0.3 mg/dL (ref 0.0–1.2)
BUN/Creatinine Ratio: 9 (ref 9–23)
BUN: 10 mg/dL (ref 6–20)
CALCIUM: 9.6 mg/dL (ref 8.7–10.2)
CHLORIDE: 103 mmol/L (ref 96–106)
CO2: 20 mmol/L (ref 20–29)
Creatinine, Ser: 1.15 mg/dL — ABNORMAL HIGH (ref 0.57–1.00)
GFR calc Af Amer: 75 mL/min/{1.73_m2} (ref >59)
GFR, EST NON AFRICAN AMERICAN: 65 mL/min/{1.73_m2} (ref >59)
GLUCOSE: 68 mg/dL (ref 65–99)
Globulin, Total: 2.6 g/dL (ref 1.5–4.5)
POTASSIUM: 5 mmol/L (ref 3.5–5.2)
Sodium: 139 mmol/L (ref 134–144)
Total Protein: 6.8 g/dL (ref 6.0–8.5)

## 2017-05-12 LAB — PLEASE NOTE

## 2017-05-12 LAB — TSH: TSH: 4.54 u[IU]/mL — AB (ref 0.450–4.500)

## 2017-05-12 LAB — LITHIUM LEVEL: LITHIUM LVL: 0.4 mmol/L — AB (ref 0.6–1.2)

## 2017-05-12 LAB — HEMOGLOBIN A1C
ESTIMATED AVERAGE GLUCOSE: 94 mg/dL
Hgb A1c MFr Bld: 4.9 % (ref 4.8–5.6)

## 2017-06-19 ENCOUNTER — Ambulatory Visit (HOSPITAL_COMMUNITY): Payer: 59 | Admitting: Psychiatry

## 2018-03-23 ENCOUNTER — Encounter (HOSPITAL_COMMUNITY): Payer: Self-pay | Admitting: *Deleted

## 2018-03-23 ENCOUNTER — Other Ambulatory Visit: Payer: Self-pay

## 2018-03-23 ENCOUNTER — Emergency Department (HOSPITAL_COMMUNITY)
Admission: EM | Admit: 2018-03-23 | Discharge: 2018-03-23 | Disposition: A | Discharge status: Home / Self Care | Payer: Managed Care, Other (non HMO) | Attending: Emergency Medicine | Admitting: Emergency Medicine

## 2018-03-23 DIAGNOSIS — G4700 Insomnia, unspecified: Secondary | ICD-10-CM | POA: Insufficient documentation

## 2018-03-23 DIAGNOSIS — Z760 Encounter for issue of repeat prescription: Secondary | ICD-10-CM | POA: Diagnosis not present

## 2018-03-23 DIAGNOSIS — Z7984 Long term (current) use of oral hypoglycemic drugs: Secondary | ICD-10-CM | POA: Diagnosis not present

## 2018-03-23 DIAGNOSIS — F419 Anxiety disorder, unspecified: Secondary | ICD-10-CM

## 2018-03-23 DIAGNOSIS — Z79899 Other long term (current) drug therapy: Secondary | ICD-10-CM | POA: Insufficient documentation

## 2018-03-23 DIAGNOSIS — Z720 Tobacco use: Secondary | ICD-10-CM

## 2018-03-23 DIAGNOSIS — F17210 Nicotine dependence, cigarettes, uncomplicated: Secondary | ICD-10-CM | POA: Diagnosis not present

## 2018-03-23 LAB — COMPREHENSIVE METABOLIC PANEL
ALBUMIN: 4.4 g/dL (ref 3.5–5.0)
ALK PHOS: 98 U/L (ref 38–126)
ALT: 21 U/L (ref 14–54)
AST: 22 U/L (ref 15–41)
Anion gap: 7 (ref 5–15)
BILIRUBIN TOTAL: 0.5 mg/dL (ref 0.3–1.2)
BUN: 8 mg/dL (ref 6–20)
CALCIUM: 9.1 mg/dL (ref 8.9–10.3)
CO2: 23 mmol/L (ref 22–32)
CREATININE: 0.77 mg/dL (ref 0.44–1.00)
Chloride: 111 mmol/L (ref 101–111)
GFR calc non Af Amer: >60 mL/min (ref >60)
GLUCOSE: 94 mg/dL (ref 65–99)
Potassium: 3.9 mmol/L (ref 3.5–5.1)
Sodium: 141 mmol/L (ref 135–145)
TOTAL PROTEIN: 7.5 g/dL (ref 6.5–8.1)

## 2018-03-23 LAB — CBC
HEMATOCRIT: 43.7 % (ref 36.0–46.0)
HEMOGLOBIN: 15.7 g/dL — AB (ref 12.0–15.0)
MCH: 29.6 pg (ref 26.0–34.0)
MCHC: 35.9 g/dL (ref 30.0–36.0)
MCV: 82.3 fL (ref 78.0–100.0)
Platelets: 339 10*3/uL (ref 150–400)
RBC: 5.31 MIL/uL — ABNORMAL HIGH (ref 3.87–5.11)
RDW: 12.8 % (ref 11.5–15.5)
WBC: 8.5 10*3/uL (ref 4.0–10.5)

## 2018-03-23 LAB — RAPID URINE DRUG SCREEN, HOSP PERFORMED
Amphetamines: NOT DETECTED
BARBITURATES: NOT DETECTED
Benzodiazepines: NOT DETECTED
Cocaine: NOT DETECTED
Opiates: NOT DETECTED
TETRAHYDROCANNABINOL: NOT DETECTED

## 2018-03-23 LAB — ETHANOL: Alcohol, Ethyl (B): <10 mg/dL (ref <10)

## 2018-03-23 LAB — SALICYLATE LEVEL: Salicylate Lvl: <7 mg/dL (ref 2.8–30.0)

## 2018-03-23 LAB — ACETAMINOPHEN LEVEL

## 2018-03-23 LAB — PREGNANCY, URINE: PREG TEST UR: NEGATIVE

## 2018-03-23 MED ORDER — BUSPIRONE HCL 10 MG PO TABS
10.0000 mg | ORAL_TABLET | Freq: Once | ORAL | Status: AC
  Administered 2018-03-23: 10 mg via ORAL
  Filled 2018-03-23: qty 1

## 2018-03-23 MED ORDER — CYCLOBENZAPRINE HCL 10 MG PO TABS: 10.0000 mg | ORAL_TABLET | Freq: Every day | ORAL | 0 refills | Status: DC

## 2018-03-23 MED ORDER — QUETIAPINE FUMARATE 200 MG PO TABS: 200.0000 mg | ORAL_TABLET | Freq: Every day | ORAL | 0 refills | Status: DC

## 2018-03-23 MED ORDER — LITHIUM CARBONATE 300 MG PO CAPS
900.0000 mg | ORAL_CAPSULE | Freq: Once | ORAL | Status: AC
  Administered 2018-03-23: 900 mg via ORAL
  Filled 2018-03-23: qty 3

## 2018-03-23 MED ORDER — CYCLOBENZAPRINE HCL 10 MG PO TABS
5.0000 mg | ORAL_TABLET | Freq: Once | ORAL | Status: AC
  Administered 2018-03-23: 5 mg via ORAL
  Filled 2018-03-23: qty 1

## 2018-03-23 NOTE — ED Triage Notes (Addendum)
Patient reported she quit "cold [place]" taking her Suboxone, Lithium, Buspar, Prozosin, Flexeril, and Seqouuel two weeks ago. Patient reports anxiety and agitation. She is requesting something to "help" her get comfortable. Patient reports she hasn't been sleeping. Patient has no thoughts of harming herself.

## 2018-03-23 NOTE — ED Provider Notes (Signed)
[facility]-EMERGENCY DEPT Provider Note   CSN: [id number] Arrival date & time: 03/23/18  1413     History   Chief Complaint Chief Complaint  Patient presents with  . Medical Clearance    HPI Jenna Wilson is a 27 y.o. female with a PMHx of anxiety, depression, insomnia, PTSD, and prior polysubstance abuse, who presents to the ED with complaints of anxiety and insomnia for the last 2 weeks.  Patient states that she was in rehab for drugs and alcohol in [place], 2 weeks ago she was discharged and they did not discharge her with any refills for her medications.  She has been having difficulty sleeping and feeling very anxious since being off of her medications, she has not been able to try anything at home since she does not have her medications.  The only thing that aggravates her symptoms is being off of her medications.  She states that she has been doing well and is in a halfway house, does not want to have relapse and just wants to be back on her medications.  These medications include: Lithium 900 mg in the morning, BuSpar 10 mg 3 times daily, prazosin 2 mg at bedtime, Seroquel 200 mg at bedtime, doxepin 100 mg at bedtime, and Flexeril and Zanaflex although she is not sure of the doses of those.  She was also on Suboxone but she does not want to be put back on that, she wants to stay off of it.  Her only symptom is having some diarrhea due to the Suboxone withdrawal, states that she is having about 5 episodes daily of nonbloody diarrhea, but otherwise she denies any other medical complaints at this time.  She denies any melena, hematochezia, abdominal pain, nausea, vomiting, urinary symptoms, or any other complaints.  She denies SI, HI, AVH, illicit drug use, or alcohol use.  She admits to being a cigarette smoker.  She does not have a psychiatrist here although she was previously under the care of Dr. Rene Kocher but she was dismissed from his practice due to missed appointments.      The history is provided by the patient and medical records. No language interpreter was used.    Past Medical History:  Diagnosis Date  . Anxiety   . Depression   . Hx of drug abuse    past heroin addict    Patient Active Problem List   Diagnosis Date Noted  . Cocaine abuse with cocaine-induced mood disorder (HCC) 03/09/2017  . Chronic post-traumatic stress disorder (PTSD) 11/27/2016  . Insomnia due to anxiety and fear 11/27/2016  . GAD (generalized anxiety disorder) 05/06/2016  . Post partum depression 05/05/2014  . NVD (normal vaginal delivery) 03/22/2014  . Other specified indication for care or intervention related to labor and delivery, unspecified as to episode of care 03/21/2014    Past Surgical History:  Procedure Laterality Date  . NO PAST SURGERIES       OB History    Gravida  2   Para  2   Term  2   Preterm      AB      Living  2     SAB      TAB      Ectopic      Multiple      Live Births  2            Home Medications    Prior to Admission medications   Medication Sig Start Date End  Date Taking? Authorizing Provider  buprenorphine (SUBUTEX) 8 MG SUBL SL tablet Place 8 mg under the tongue daily.  03/02/18  Yes [provider]  busPIRone (BUSPAR) 10 MG tablet Take 1 tablet (10 mg total) by mouth 3 (three) times daily. 05/08/17  Yes Eksir, Bo McclintockAlexander Arya, MD  cyclobenzaprine (FLEXERIL) 10 MG tablet Take 10 mg by mouth 3 (three) times daily as needed for muscle spasms.  12/16/17  Yes [provider]  lithium 600 MG capsule Take 600 mg by mouth daily.  03/02/18  Yes [provider]  prazosin (MINIPRESS) 2 MG capsule Take 1 capsule (2 mg total) by mouth 2 (two) times daily. 05/08/17  Yes Eksir, Bo McclintockAlexander Arya, MD  QUEtiapine Fumarate (SEROQUEL XR) 150 MG 24 hr tablet Take 150 mg by mouth at bedtime.  01/20/18  Yes [provider]  cyclobenzaprine (FLEXERIL) 5 MG tablet Take 2 tablets (10 mg total) by mouth at  bedtime. Patient not taking: Reported on 03/23/2018 05/08/17   Burnard LeighEksir, Alexander Arya, MD  lithium carbonate (LITHOBID) 300 MG CR tablet Take 1 tablet (300 mg total) by mouth daily. Takes 2 tablets by mouth in the morning and 1 tablet by mouth at bedtime. Patient not taking: Reported on 03/23/2018 05/08/17   Burnard LeighEksir, Alexander Arya, MD  metFORMIN (GLUCOPHAGE) 500 MG tablet Take 1 tablet (500 mg total) by mouth daily with breakfast. Patient not taking: Reported on 03/23/2018 05/08/17 05/08/18  Burnard LeighEksir, Alexander Arya, MD  QUEtiapine (SEROQUEL) 200 MG tablet Take 1 tablet (200 mg total) by mouth at bedtime. Patient not taking: Reported on 03/23/2018 05/08/17   Burnard LeighEksir, Alexander Arya, MD    Family History Family History  Problem Relation Age of Onset  . Alcohol abuse Neg Hx   . Arthritis Neg Hx   . Asthma Neg Hx   . Birth defects Neg Hx   . Cancer Neg Hx   . COPD Neg Hx   . Depression Neg Hx   . Diabetes Neg Hx   . Drug abuse Neg Hx   . Early death Neg Hx   . Hearing loss Neg Hx   . Heart disease Neg Hx   . Hyperlipidemia Neg Hx   . Hypertension Neg Hx   . Kidney disease Neg Hx   . Learning disabilities Neg Hx   . Mental illness Neg Hx   . Mental retardation Neg Hx   . Miscarriages / Stillbirths Neg Hx   . Stroke Neg Hx   . Vision loss Neg Hx   . Varicose Veins Neg Hx     Social History Social History   Tobacco Use  . Smoking status: Current Every Day Smoker    Packs/day: 1.00    Years: 14.00    Pack years: 14.00    Types: Cigarettes  . Smokeless tobacco: Never Used  Substance Use Topics  . Alcohol use: No    Comment: None in over a month  . Drug use: No    Comment: stopped several years ago     Allergies   Patient has no known allergies.   Review of Systems Review of Systems  Constitutional: Negative for chills and fever.  Respiratory: Negative for shortness of breath.   Cardiovascular: Negative for chest pain.  Gastrointestinal: Positive for diarrhea. Negative for  abdominal pain, blood in stool, constipation, nausea and vomiting.  Genitourinary: Negative for dysuria and hematuria.  Musculoskeletal: Negative for arthralgias and myalgias.  Skin: Negative for color change.  Allergic/Immunologic: Negative for immunocompromised state.  Neurological: Negative for weakness and numbness.  Psychiatric/Behavioral: Positive for sleep disturbance. Negative for confusion, hallucinations and suicidal ideas. The patient is nervous/anxious.    All other systems reviewed and are negative for acute change except as noted in the HPI.    Physical Exam Updated Vital Signs BP 116/90 (BP Location: Left Arm)   Pulse (!) 106   Temp 98.3 F (36.8 C) (Oral)   Resp 13   Ht 5\' 2"  (1.575 m)   Wt 74.4 kg (164 lb)   SpO2 96%   BMI 30.00 kg/m   Physical Exam  Constitutional: She is oriented to person, place, and time. Vital signs are normal. She appears well-developed and well-nourished.  Non-toxic appearance. No distress.  Afebrile, nontoxic, NAD  HENT:  Head: Normocephalic and atraumatic.  Mouth/Throat: Oropharynx is clear and moist and mucous membranes are normal.  Eyes: Conjunctivae and EOM are normal. Right eye exhibits no discharge. Left eye exhibits no discharge.  Neck: Normal range of motion. Neck supple.  Cardiovascular: Regular rhythm, normal heart sounds and intact distal pulses. Tachycardia present. Exam reveals no gallop and no friction rub.  No murmur heard. Very slightly tachycardic in the 100s  Pulmonary/Chest: Effort normal and breath sounds normal. No respiratory distress. She has no decreased breath sounds. She has no wheezes. She has no rhonchi. She has no rales.  Abdominal: Soft. Normal appearance and bowel sounds are normal. She exhibits no distension. There is no tenderness. There is no rigidity, no rebound, no guarding, no CVA tenderness, no tenderness at McBurney's point and negative Murphy's sign.  Musculoskeletal: Normal range of motion.    Neurological: She is alert and oriented to person, place, and time. She has normal strength. No sensory deficit.  Skin: Skin is warm, dry and intact. No rash noted.  Psychiatric: Her mood appears anxious. She is not actively hallucinating. She expresses no homicidal and no suicidal ideation. She expresses no suicidal plans and no homicidal plans.  Slightly anxious affect, but pleasant and cooperative. Denies SI, HI, or AVH, doesn't seem to be responding to internal stimuli.   Nursing note and vitals reviewed.    ED Treatments / Results  Labs (all labs ordered are listed, but only abnormal results are displayed) Labs Reviewed  ACETAMINOPHEN LEVEL - Abnormal; Notable for the following components:      Result Value   Acetaminophen (Tylenol), Serum <10 (*)    All other components within normal limits  CBC - Abnormal; Notable for the following components:   RBC 5.31 (*)    Hemoglobin 15.7 (*)    All other components within normal limits  COMPREHENSIVE METABOLIC PANEL  ETHANOL  SALICYLATE LEVEL  RAPID URINE DRUG SCREEN, HOSP PERFORMED  PREGNANCY, URINE    EKG None  Radiology No results found.  Procedures Procedures (including critical care time)  Medications Ordered in ED Medications  lithium carbonate capsule 900 mg (900 mg Oral Given 03/23/18 1716)  busPIRone (BUSPAR) tablet 10 mg (10 mg Oral Given 03/23/18 1716)  cyclobenzaprine (FLEXERIL) tablet 5 mg (5 mg Oral Given 03/23/18 1828)     Initial Impression / Assessment and Plan / ED Course  I have reviewed the triage vital signs and the nursing notes.  Pertinent labs & imaging results that were available during my care of the patient were reviewed by me and considered in my medical decision making (see chart for details).     27 y.o. female here with anxiety and insomnia since stopping her meds 2wks ago after  getting out of rehab and not being sent home with her medications. On exam, slightly anxious appearing, mildly  tachycardic but overall reassuring exam. Denies SI/HI/AVH, no drugs/EtOH use anymore. +Smoker, cessation advised. Work up thus far shows: CBC WNL, CMP WNL, EtOH level undetectable, Salicylate/acetaminophen levels undetectable, Upreg neg, UDS unremarkable. Discussed case with Idalia Needle of TTS who doesn't feel she meets any criteria for inpatient treatment, but will discuss case with the psychiatrist to see if they'd be comfortable with sending her home with refills of her meds. Will give lithium and buspar now since she'd usually have these during the day, and reassess shortly.   6:40 PM TTS provider spoke with psychiatric team who also don't feel comfortable restarting her psych meds; recommend outpatient resources to find psych care outpatient for ongoing psych care and for medication refills. I don't feel the pt is a danger to herself or others, and I don't think we need to keep her here for further emergent work up at this time. Advised calling tomorrow to get established with psychiatric team, for ongoing medication needs. Smoking cessation again encouraged. Pt very anxious about this plan, requesting if we could at least give her refills of seroquel and flexeril just to get her through until she can get in with psych; I think it's reasonable to give a short course of these two, but discussed that future refills would need to be given by outpatient psychiatrist, and that the rest of her meds would need to be refilled by psych team outpatient. Pt agreeable to this plan. I explained the diagnosis and have given explicit precautions to return to the ER including for any other new or worsening symptoms. The patient understands and accepts the medical plan as it's been dictated and I have answered their questions. Discharge instructions concerning home care and prescriptions have been given. The patient is STABLE and is discharged to home in good condition.    Final Clinical Impressions(s) / ED Diagnoses   Final  diagnoses:  Insomnia, unspecified type  Anxiety  Tobacco user  Medication refill    ED Discharge Orders        Ordered    cyclobenzaprine (FLEXERIL) 10 MG tablet  Daily at bedtime     03/23/18 1815    QUEtiapine (SEROQUEL) 200 MG tablet  Daily at bedtime     03/23/18 [address] [place], [place] 03/23/18 1841    Bethann Berkshire, MD 03/23/18 2154

## 2018-03-23 NOTE — Discharge Instructions (Addendum)
Your work up today has been reassuring. Use the list below to help find psychiatric assistance in the area, to have ongoing management of your psychiatric needs and for medication refills. Stay well hydrated, get plenty of rest. Use over the counter unisom and/or melatonin to help with sleeping. You have been provided with a short course of seroquel and flexeril as a courtesy refill, but you will need to follow up with a psychiatrist for further medication refills. Call the resources provided ASAP for ongoing psychiatric care. Return to the ER for emergent changes or worsening symptoms. STOP SMOKING CIGARETTES!

## 2019-01-15 IMAGING — US US TRANSVAGINAL NON-OB
1 series · 15 of 25 positions shown · non-contrast
Comparison: Previous OB ultrasound 05/31/2012

CLINICAL DATA: Gravida 2 para 2. Lower abdominal pain since this
morning. LMP 02/01/2017.



[Series 1: us transvaginal non-ob · 15 of 53 slices shown]
[im 1/53]
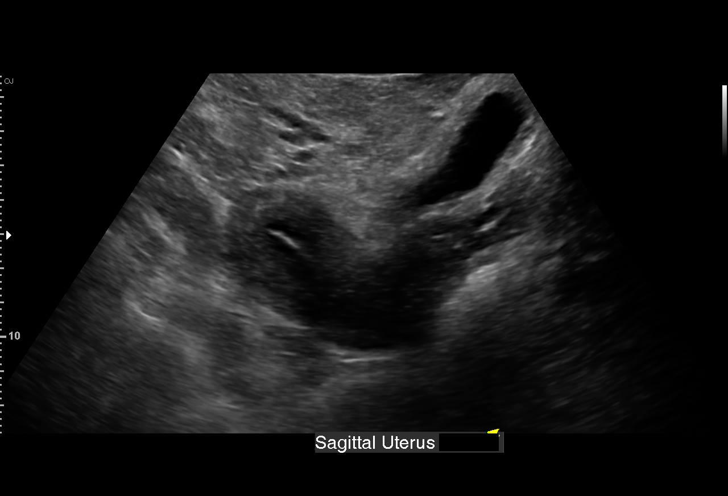
[im 5/53]
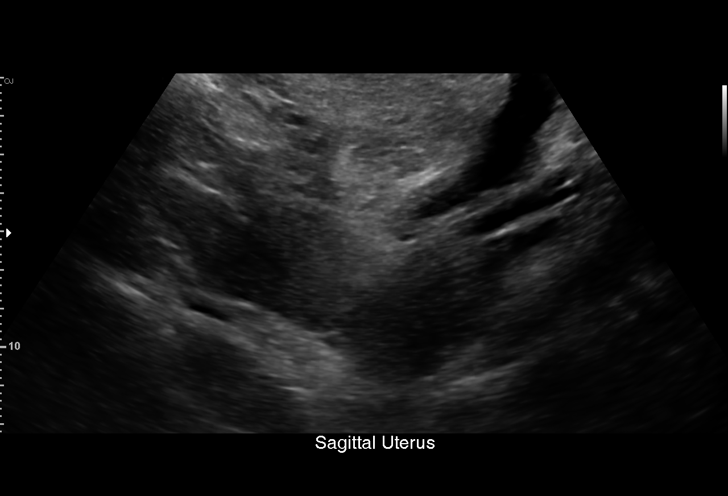
[im 9/53]
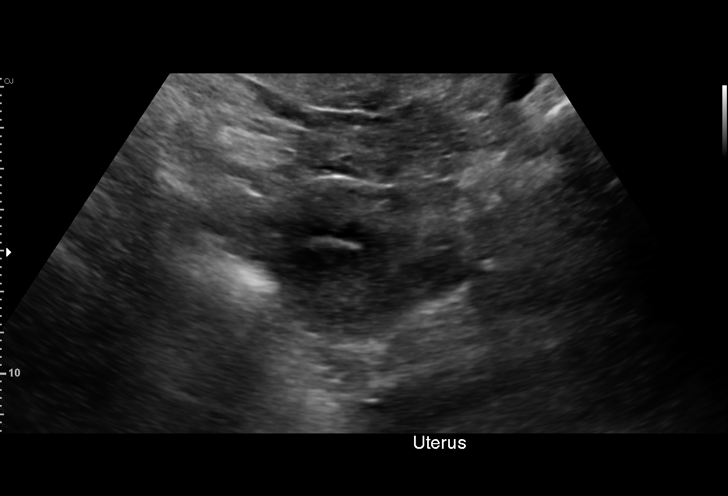
[im 11/53]
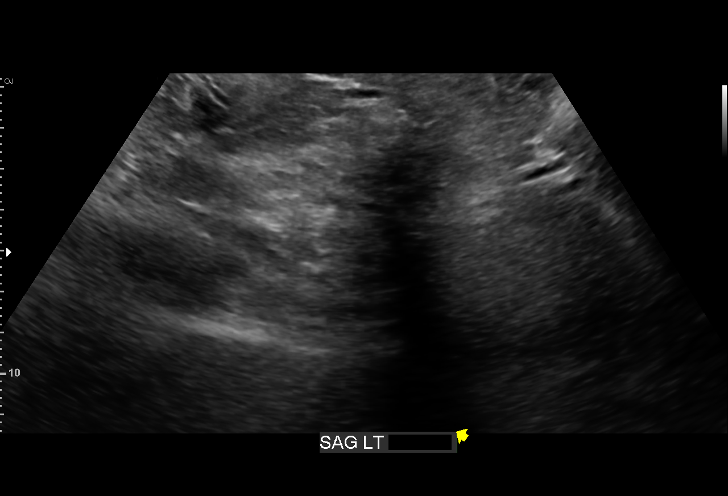
[im 16/53]
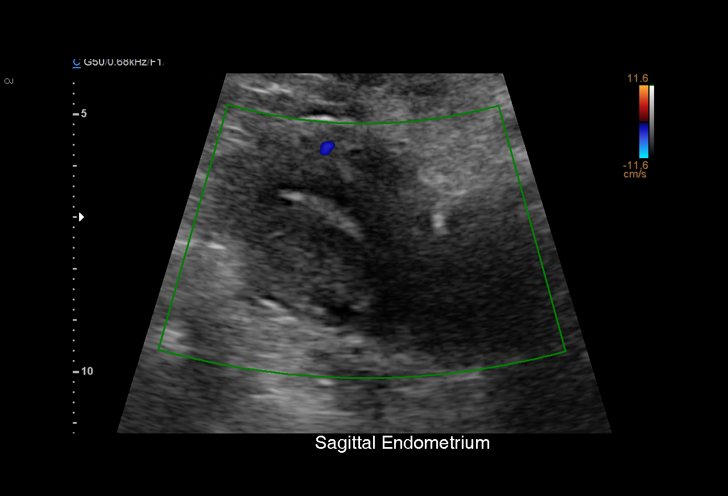
[im 20/53]
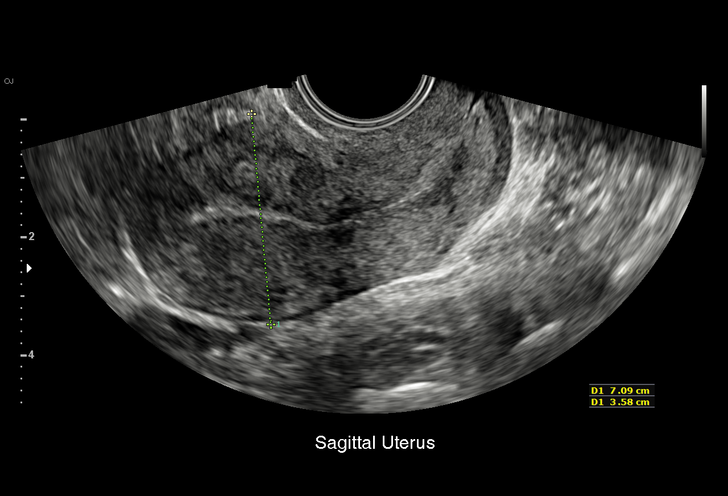
[im 22/53]
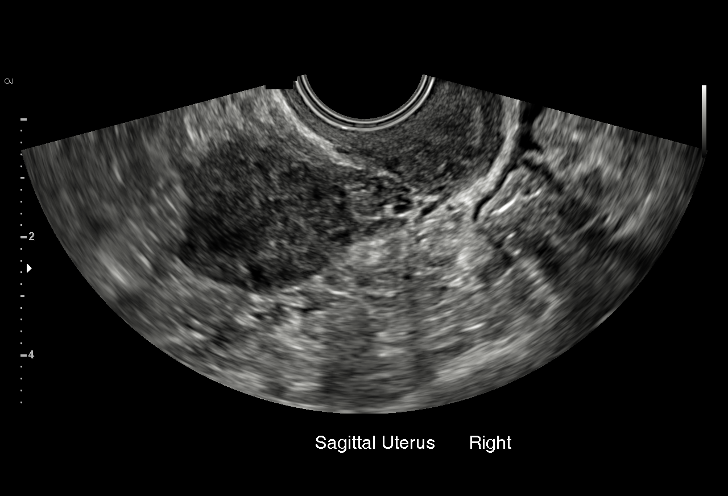
[im 27/53]
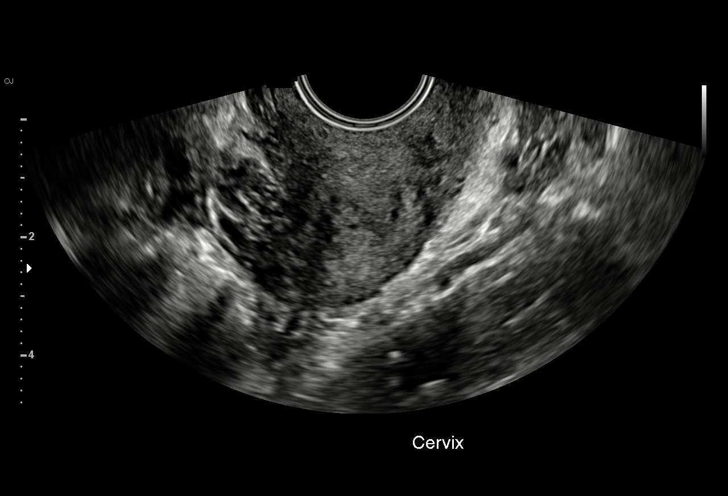
[im 31/53]
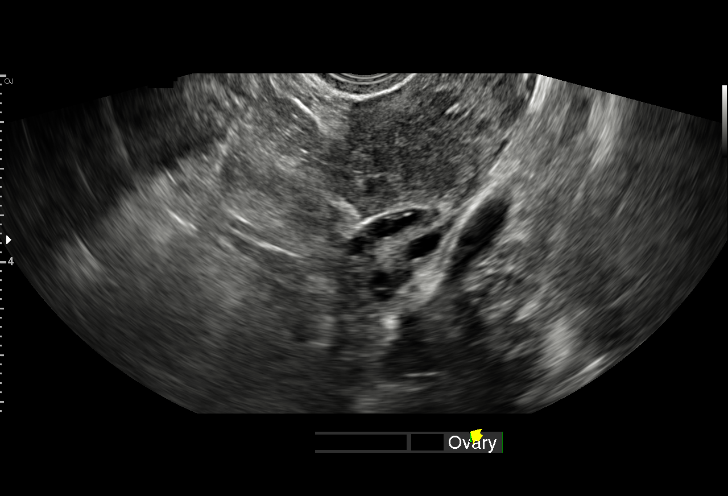
[im 33/53]
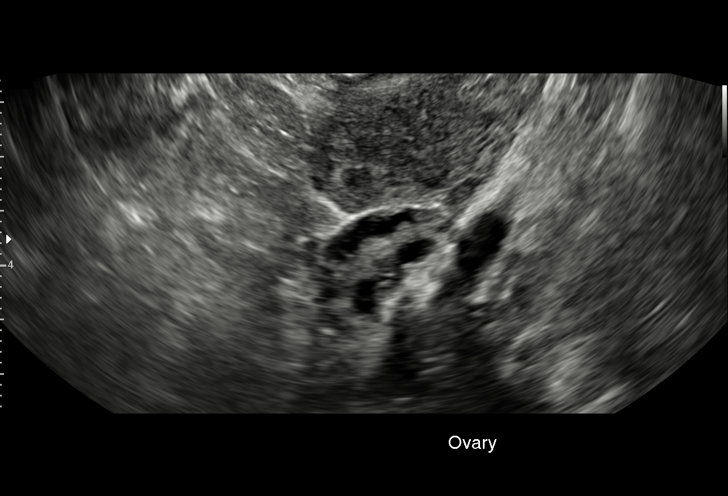
[im 37/53]
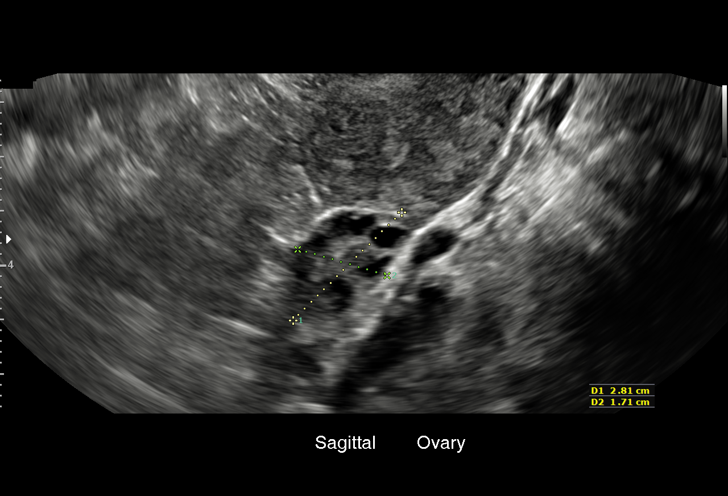
[im 42/53]
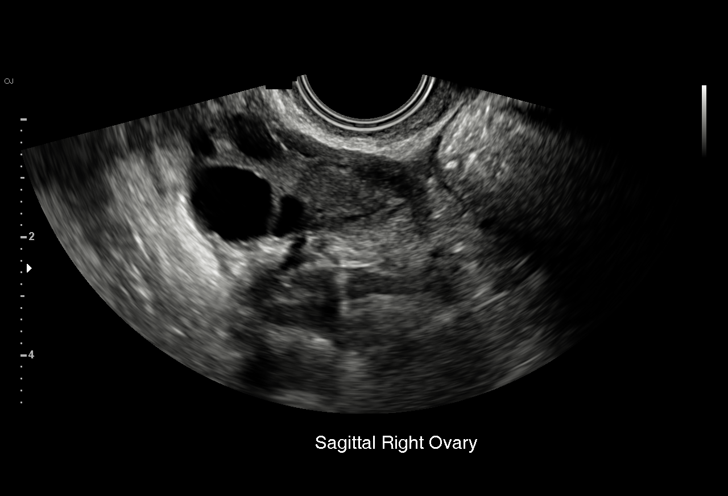
[im 44/53]
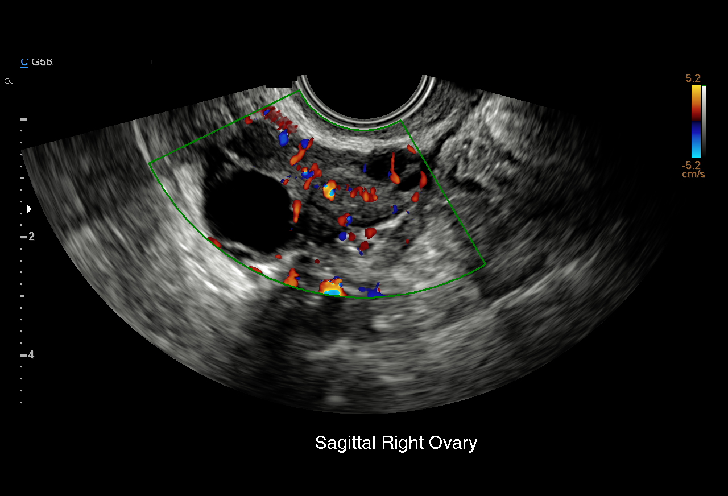
[im 48/53]
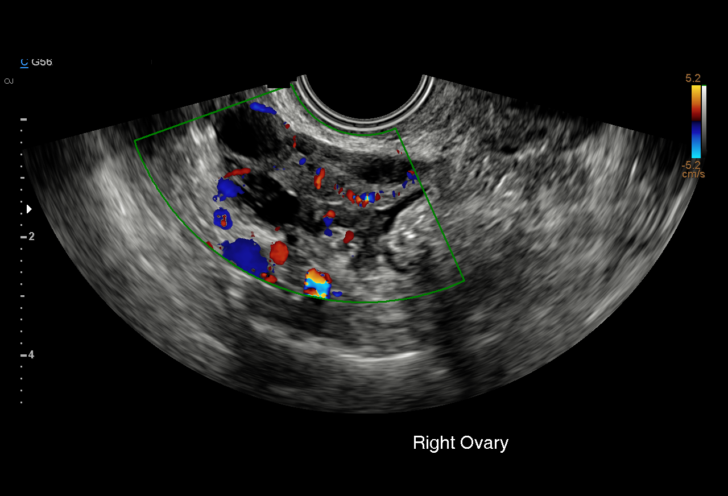
[im 53/53]
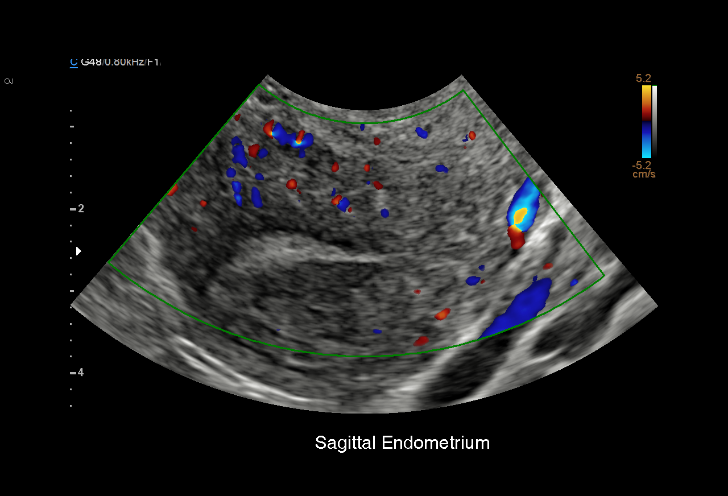

[15 of 25 positions shown; findings below may reference images not displayed]

FINDINGS: Uterus

Measurements: 7.1 x 3.6 x 4.5 cm. No fibroids or other mass
visualized.

Endometrium

Thickness: 3.2 mm.  No focal abnormality visualized.

Right ovary

Measurements: 4.0 x 2.4 x 3.1 cm. Normal appearance/no adnexal mass.

Left ovary

Measurements: 2.8 x 1.7 x 1.7 cm. Normal appearance/no adnexal mass.

Other findings

No abnormal free fluid.
IMPRESSION: Normal pelvic ultrasound.

## 2022-10-17 ENCOUNTER — Encounter (HOSPITAL_BASED_OUTPATIENT_CLINIC_OR_DEPARTMENT_OTHER): Payer: Self-pay | Admitting: Emergency Medicine

## 2022-10-17 ENCOUNTER — Emergency Department (HOSPITAL_BASED_OUTPATIENT_CLINIC_OR_DEPARTMENT_OTHER): Payer: Worker's Compensation

## 2022-10-17 ENCOUNTER — Emergency Department (HOSPITAL_BASED_OUTPATIENT_CLINIC_OR_DEPARTMENT_OTHER): Payer: Worker's Compensation | Admitting: Radiology

## 2022-10-17 ENCOUNTER — Emergency Department (HOSPITAL_BASED_OUTPATIENT_CLINIC_OR_DEPARTMENT_OTHER)
Admission: EM | Admit: 2022-10-17 | Discharge: 2022-10-17 | Disposition: A | Discharge status: Home / Self Care | Payer: Worker's Compensation | Attending: Emergency Medicine | Admitting: Emergency Medicine

## 2022-10-17 ENCOUNTER — Other Ambulatory Visit: Payer: Self-pay

## 2022-10-17 DIAGNOSIS — S8990XA Unspecified injury of unspecified lower leg, initial encounter: Secondary | ICD-10-CM | POA: Diagnosis present

## 2022-10-17 DIAGNOSIS — Y9289 Other specified places as the place of occurrence of the external cause: Secondary | ICD-10-CM | POA: Insufficient documentation

## 2022-10-17 DIAGNOSIS — S8265XA Nondisplaced fracture of lateral malleolus of left fibula, initial encounter for closed fracture: Secondary | ICD-10-CM | POA: Diagnosis not present

## 2022-10-17 DIAGNOSIS — X58XXXA Exposure to other specified factors, initial encounter: Secondary | ICD-10-CM | POA: Insufficient documentation

## 2022-10-17 DIAGNOSIS — Y998 Other external cause status: Secondary | ICD-10-CM | POA: Insufficient documentation

## 2022-10-17 DIAGNOSIS — Y9389 Activity, other specified: Secondary | ICD-10-CM | POA: Insufficient documentation

## 2022-10-17 LAB — HCG, SERUM, QUALITATIVE: Preg, Serum: NEGATIVE

## 2022-10-17 MED ORDER — HYDROCODONE-ACETAMINOPHEN 5-325 MG PO TABS
1.0000 | ORAL_TABLET | Freq: Once | ORAL | Status: AC
  Administered 2022-10-17: 1 via ORAL
  Filled 2022-10-17: qty 1

## 2022-10-17 MED ORDER — HYDROCODONE-ACETAMINOPHEN 5-325 MG PO TABS: 1.0000 | ORAL_TABLET | Freq: Four times a day (QID) | ORAL | 0 refills | Status: DC | PRN

## 2022-10-17 MED ORDER — OXYCODONE-ACETAMINOPHEN 5-325 MG PO TABS
1.0000 | ORAL_TABLET | Freq: Once | ORAL | Status: AC
  Administered 2022-10-17: 1 via ORAL
  Filled 2022-10-17: qty 1

## 2022-10-17 NOTE — Discharge Instructions (Addendum)
Please follow-up with orthopedics, I have prescribed you a short dose of pain medication, to help with your pain control, you need to follow-up with orthopedics though for definitive treatment.  If you have any redness, swelling, severe pain, loss of color of your leg, or no pulse in your foot please return to the ER.  If you get your splint wet please return to the ER as it will likely need to be cut off.  Do not get your splint wet use a trash bag to help keep it dry while showering.

## 2022-10-17 NOTE — ED Triage Notes (Signed)
L foot and ankle pain after a fall today at work.  Unable to bear weight, endorses tingling to injured foot.

## 2022-10-17 NOTE — ED Provider Triage Note (Signed)
Emergency Medicine Provider Triage Evaluation Note  Jenna Wilson , a 32 y.o. female  was evaluated in triage.  Pt complains of falling after jumping out of van at work today. Landed on side, c/o L ankle and foot pain. No hip pain, chest pain, abdominal pain, head injury. Took ibuprofen at work. Can't bear weight.  Review of Systems  Positive: Ankle pain Negative: rash  Physical Exam  There were no vitals taken for this visit. Gen:   Awake, no distress   Resp:  Normal effort  MSK:   Moves extremities without difficulty  Other:  TTP of lateral malleolous of L ankle, swelling of left ankle, +dorsalis pedis pulse, +TTP of proximal femur left   Medical Decision Making  Medically screening exam initiated at 4:37 PM.  Appropriate orders placed.  Sally Menard was informed that the remainder of the evaluation will be completed by another provider, this initial triage assessment does not replace that evaluation, and the importance of remaining in the ED until their evaluation is complete.     Osvaldo Shipper, [place] 10/17/22 [id number]

## 2022-10-17 NOTE — ED Provider Notes (Signed)
[facility] EMERGENCY DEPT Provider Note   CSN: [id number] Arrival date & time: 10/17/22  1622     History  Chief Complaint  Patient presents with   Leg Injury    Jenna Wilson is a 32 y.o. female, no pertinent past medical history, who presents to the ED secondary to left ankle pain, left femur pain that occurred after she jumped out of a van today.  She states she was getting out of the [employer] she uses for work, and jumped out of the [place], and had immediate left ankle pain, and heard a pop.  She states she has severe pain, has attempted to ice it, but it is swollen.  She denies any numbness, tingling, loss of sensation to the foot.  No color changes.  No open abrasions.  Also reports some left femur pain.  Has not take anything for the pain as that she was at work, and came straight here.  She works as a Engineer, manufacturing.     Home Medications Prior to Admission medications   Medication Sig Start Date End Date Taking? Authorizing Provider  buprenorphine (SUBUTEX) 8 MG SUBL SL tablet Place 8 mg under the tongue daily.  03/02/18   [provider]  busPIRone (BUSPAR) 10 MG tablet Take 1 tablet (10 mg total) by mouth 3 (three) times daily. 05/08/17   Eksir, Richard Miu, MD  cyclobenzaprine (FLEXERIL) 10 MG tablet Take 10 mg by mouth 3 (three) times daily as needed for muscle spasms.  12/16/17   [provider]  cyclobenzaprine (FLEXERIL) 10 MG tablet Take 1 tablet (10 mg total) by mouth at bedtime. 03/23/18   Street, [place], PA-C  cyclobenzaprine (FLEXERIL) 5 MG tablet Take 2 tablets (10 mg total) by mouth at bedtime. Patient not taking: Reported on 03/23/2018 05/08/17   Aundra Dubin, MD  HYDROcodone-acetaminophen [facility]) 5-325 MG tablet Take 1 tablet by mouth every 6 (six) hours as needed for moderate pain. 10/17/22   Milka Windholz L, PA  lithium 600 MG capsule Take 600 mg by mouth daily.  03/02/18   [provider]  lithium carbonate  (LITHOBID) 300 MG CR tablet Take 1 tablet (300 mg total) by mouth daily. Takes 2 tablets by mouth in the morning and 1 tablet by mouth at bedtime. Patient not taking: Reported on 03/23/2018 05/08/17   Aundra Dubin, MD  metFORMIN (GLUCOPHAGE) 500 MG tablet Take 1 tablet (500 mg total) by mouth daily with breakfast. Patient not taking: Reported on 03/23/2018 05/08/17 05/08/18  Aundra Dubin, MD  prazosin (MINIPRESS) 2 MG capsule Take 1 capsule (2 mg total) by mouth 2 (two) times daily. 05/08/17   Aundra Dubin, MD  QUEtiapine (SEROQUEL) 200 MG tablet Take 1 tablet (200 mg total) by mouth at bedtime. Patient not taking: Reported on 03/23/2018 05/08/17   Aundra Dubin, MD  QUEtiapine (SEROQUEL) 200 MG tablet Take 1 tablet (200 mg total) by mouth at bedtime. 03/23/18   Street, [place], PA-C  QUEtiapine Fumarate (SEROQUEL XR) 150 MG 24 hr tablet Take 150 mg by mouth at bedtime.  01/20/18   [provider]      Allergies    Patient has no known allergies.    Review of Systems   Review of Systems  Musculoskeletal:        +L ankle pain  Skin:  Negative for rash.    Physical Exam Updated Vital Signs BP 111/72 (BP Location: Left Arm)   Pulse 84  Temp 97.8 F (36.6 C)   Resp 20   Wt 81.6 kg   LMP 10/05/2022   SpO2 100%   BMI 32.92 kg/m  Physical Exam Vitals and nursing note reviewed.  Constitutional:      General: She is not in acute distress.    Appearance: She is well-developed.  HENT:     Head: Normocephalic and atraumatic.  Eyes:     Conjunctiva/sclera: Conjunctivae normal.  Cardiovascular:     Rate and Rhythm: Normal rate and regular rhythm.     Pulses: Normal pulses.  Pulmonary:     Effort: Pulmonary effort is normal.  Abdominal:     Palpations: Abdomen is soft.     Tenderness: There is no abdominal tenderness.  Musculoskeletal:     Cervical back: Neck supple.     Comments: Left ankle: TTP of lateral malleolus. Edema noted to lateral  ankle. Not able to bear weight. Able to plantar flex and dorsiflex ankle, but causes great pain. Inversion/eversion intact. Negative Thompson test. No midfoot tor base of 5th metatarsal tenderness to palpation. Capillary refill <2sec. Dorsalis pedis pulse present. No foot drop noted. Sensation intact. Warm to touch.   TTP of L mid shaft femur   Skin:    General: Skin is warm and dry.     Capillary Refill: Capillary refill takes less than 2 seconds.  Neurological:     Mental Status: She is alert.  Psychiatric:        Mood and Affect: Mood normal.     ED Results / Procedures / Treatments   Labs (all labs ordered are listed, but only abnormal results are displayed) Labs Reviewed  HCG, SERUM, QUALITATIVE    EKG None  Radiology DG Ankle Complete Left  Result Date: 10/17/2022 CLINICAL DATA:  Left foot and ankle pain after twisting injury EXAM: LEFT ANKLE COMPLETE - 3 VIEW COMPARISON:  None Available. FINDINGS: There is nondisplaced minimally comminuted fracture of the lateral malleolus. No additional acute fracture visualized.The ankle mortise is intact. Joint spaces are well-preserved. Moderate ankle soft tissue swelling. IMPRESSION: Nondisplaced minimally comminuted fracture of the lateral malleolus. Electronically Signed   By: Beryle Flock M.D.   On: 10/17/2022 19:09    Procedures Procedures    Medications Ordered in ED Medications  oxyCODONE-acetaminophen (PERCOCET/ROXICET) 5-325 MG per tablet 1 tablet (has no administration in time range)  HYDROcodone-acetaminophen (NORCO/VICODIN) 5-325 MG per tablet 1 tablet (1 tablet Oral Given 10/17/22 1644)    ED Course/ Medical Decision Making/ A&P                           Medical Decision Making Patient is a 32 year old female, who complains of left ankle pain after getting out of the [place] today, she heard a pop when she got out of the [place].  Also states that her left femur hurts a little bit.  We will obtain x-rays of both, and give  her Norco for pain.  She does have a pulse in her foot and good peripheral capillary refill.  No neurologic deficits.  Range of motion intact, EXTR however extremely painful.  Unable to bear weight.  Amount and/or Complexity of Data Reviewed Labs: ordered.    Details: Urine preg negative Radiology: ordered.    Details: Nondisplaced comminuted fracture of the lateral malleolus Discussion of management or test interpretation with external provider(s): Discussed with Dr. Melina Copa, we will place in posterior short leg splint and have f/u with ortho.  Crutches provided. Patient refused femur xray.  Short supply of Norco sent to the pharmacy for pain control, return precautions emphasized, and orthopedics information provided.  Risk Prescription drug management.    Final Clinical Impression(s) / ED Diagnoses Final diagnoses:  Nondisplaced fracture of lateral malleolus of left fibula, initial encounter for closed fracture    Rx / DC Orders ED Discharge Orders          Ordered    HYDROcodone-acetaminophen (NORCO) 5-325 MG tablet  Every 6 hours PRN,   Status:  Discontinued        10/17/22 2216    HYDROcodone-acetaminophen (NORCO) 5-325 MG tablet  Every 6 hours PRN        10/17/22 2219              Osvaldo Shipper, PA 10/17/22 2221    Hayden Rasmussen, MD 10/18/22 1106

## 2023-10-14 ENCOUNTER — Other Ambulatory Visit: Payer: Self-pay

## 2023-10-14 ENCOUNTER — Encounter (HOSPITAL_COMMUNITY): Payer: Self-pay | Admitting: Emergency Medicine

## 2023-10-14 ENCOUNTER — Inpatient Hospital Stay (HOSPITAL_COMMUNITY)
Admission: EM | Admit: 2023-10-14 | Discharge: 2023-10-16 | DRG: 917 | Disposition: A | Discharge status: Rehabilitation Facility | Payer: MEDICAID | Attending: Internal Medicine | Admitting: Internal Medicine

## 2023-10-14 DIAGNOSIS — G9340 Encephalopathy, unspecified: Secondary | ICD-10-CM

## 2023-10-14 DIAGNOSIS — G4710 Hypersomnia, unspecified: Secondary | ICD-10-CM | POA: Diagnosis present

## 2023-10-14 DIAGNOSIS — T1491XA Suicide attempt, initial encounter: Principal | ICD-10-CM

## 2023-10-14 DIAGNOSIS — F17210 Nicotine dependence, cigarettes, uncomplicated: Secondary | ICD-10-CM | POA: Diagnosis present

## 2023-10-14 DIAGNOSIS — Z79899 Other long term (current) drug therapy: Secondary | ICD-10-CM

## 2023-10-14 DIAGNOSIS — E876 Hypokalemia: Secondary | ICD-10-CM

## 2023-10-14 DIAGNOSIS — I952 Hypotension due to drugs: Secondary | ICD-10-CM | POA: Diagnosis present

## 2023-10-14 DIAGNOSIS — F411 Generalized anxiety disorder: Secondary | ICD-10-CM | POA: Diagnosis present

## 2023-10-14 DIAGNOSIS — G9341 Metabolic encephalopathy: Secondary | ICD-10-CM | POA: Diagnosis present

## 2023-10-14 DIAGNOSIS — F418 Other specified anxiety disorders: Secondary | ICD-10-CM | POA: Diagnosis present

## 2023-10-14 DIAGNOSIS — T424X2A Poisoning by benzodiazepines, intentional self-harm, initial encounter: Secondary | ICD-10-CM | POA: Diagnosis not present

## 2023-10-14 DIAGNOSIS — Z9152 Personal history of nonsuicidal self-harm: Secondary | ICD-10-CM

## 2023-10-14 DIAGNOSIS — G4700 Insomnia, unspecified: Secondary | ICD-10-CM | POA: Diagnosis present

## 2023-10-14 DIAGNOSIS — F319 Bipolar disorder, unspecified: Secondary | ICD-10-CM | POA: Diagnosis present

## 2023-10-14 DIAGNOSIS — Z66 Do not resuscitate: Secondary | ICD-10-CM | POA: Diagnosis present

## 2023-10-14 DIAGNOSIS — Z5902 Unsheltered homelessness: Secondary | ICD-10-CM

## 2023-10-14 DIAGNOSIS — T50902A Poisoning by unspecified drugs, medicaments and biological substances, intentional self-harm, initial encounter: Secondary | ICD-10-CM

## 2023-10-14 DIAGNOSIS — F1410 Cocaine abuse, uncomplicated: Secondary | ICD-10-CM | POA: Diagnosis present

## 2023-10-14 DIAGNOSIS — I10 Essential (primary) hypertension: Secondary | ICD-10-CM | POA: Diagnosis present

## 2023-10-14 DIAGNOSIS — F1990 Other psychoactive substance use, unspecified, uncomplicated: Secondary | ICD-10-CM | POA: Diagnosis present

## 2023-10-14 LAB — COMPREHENSIVE METABOLIC PANEL
ALT: 26 U/L (ref 0–44)
AST: 29 U/L (ref 15–41)
Albumin: 3.9 g/dL (ref 3.5–5.0)
Alkaline Phosphatase: 80 U/L (ref 38–126)
Anion gap: 12 (ref 5–15)
BUN: 13 mg/dL (ref 6–20)
CO2: 21 mmol/L — ABNORMAL LOW (ref 22–32)
Calcium: 8.8 mg/dL — ABNORMAL LOW (ref 8.9–10.3)
Chloride: 102 mmol/L (ref 98–111)
Creatinine, Ser: 0.81 mg/dL (ref 0.44–1.00)
GFR, Estimated: >60 mL/min (ref >60)
Glucose, Bld: 95 mg/dL (ref 70–99)
Potassium: 2.8 mmol/L — ABNORMAL LOW (ref 3.5–5.1)
Sodium: 135 mmol/L (ref 135–145)
Total Bilirubin: 1.5 mg/dL — ABNORMAL HIGH (ref 0.0–1.2)
Total Protein: 7.1 g/dL (ref 6.5–8.1)

## 2023-10-14 LAB — CBC WITH DIFFERENTIAL/PLATELET
Abs Immature Granulocytes: 0.03 10*3/uL (ref 0.00–0.07)
Basophils Absolute: 0.1 10*3/uL (ref 0.0–0.1)
Basophils Relative: 1 %
Eosinophils Absolute: 0.2 10*3/uL (ref 0.0–0.5)
Eosinophils Relative: 2 %
HCT: 44.3 % (ref 36.0–46.0)
Hemoglobin: 15.3 g/dL — ABNORMAL HIGH (ref 12.0–15.0)
Immature Granulocytes: 0 %
Lymphocytes Relative: 26 %
Lymphs Abs: 2.2 10*3/uL (ref 0.7–4.0)
MCH: 31.1 pg (ref 26.0–34.0)
MCHC: 34.5 g/dL (ref 30.0–36.0)
MCV: 90 fL (ref 80.0–100.0)
Monocytes Absolute: 0.9 10*3/uL (ref 0.1–1.0)
Monocytes Relative: 11 %
Neutro Abs: 5 10*3/uL (ref 1.7–7.7)
Neutrophils Relative %: 60 %
Platelets: 341 10*3/uL (ref 150–400)
RBC: 4.92 MIL/uL (ref 3.87–5.11)
RDW: 12.6 % (ref 11.5–15.5)
WBC: 8.4 10*3/uL (ref 4.0–10.5)
nRBC: 0 % (ref 0.0–0.2)

## 2023-10-14 LAB — SALICYLATE LEVEL: Salicylate Lvl: <7 mg/dL — ABNORMAL LOW (ref 7.0–30.0)

## 2023-10-14 LAB — RAPID URINE DRUG SCREEN, HOSP PERFORMED
Amphetamines: NOT DETECTED
Barbiturates: NOT DETECTED
Benzodiazepines: POSITIVE — AB
Cocaine: POSITIVE — AB
Opiates: NOT DETECTED
Tetrahydrocannabinol: NOT DETECTED

## 2023-10-14 LAB — CBG MONITORING, ED: Glucose-Capillary: 91 mg/dL (ref 70–99)

## 2023-10-14 LAB — LITHIUM LEVEL: Lithium Lvl: <0.06 mmol/L — ABNORMAL LOW (ref 0.60–1.20)

## 2023-10-14 LAB — MAGNESIUM: Magnesium: 2.5 mg/dL — ABNORMAL HIGH (ref 1.7–2.4)

## 2023-10-14 LAB — ACETAMINOPHEN LEVEL: Acetaminophen (Tylenol), Serum: <10 ug/mL — ABNORMAL LOW (ref 10–30)

## 2023-10-14 LAB — HCG, SERUM, QUALITATIVE: Preg, Serum: NEGATIVE

## 2023-10-14 LAB — ETHANOL: Alcohol, Ethyl (B): <10 mg/dL (ref <10)

## 2023-10-14 MED ORDER — ACETAMINOPHEN 325 MG PO TABS: 650.0000 mg | ORAL_TABLET | Freq: Four times a day (QID) | ORAL | Status: DC | PRN

## 2023-10-14 MED ORDER — POTASSIUM CHLORIDE CRYS ER 20 MEQ PO TBCR: 40.0000 meq | EXTENDED_RELEASE_TABLET | Freq: Once | ORAL | Status: DC

## 2023-10-14 MED ORDER — POTASSIUM CHLORIDE 10 MEQ/100ML IV SOLN: 10.0000 meq | Freq: Once | INTRAVENOUS | Status: DC

## 2023-10-14 MED ORDER — POTASSIUM CHLORIDE 10 MEQ/100ML IV SOLN
10.0000 meq | INTRAVENOUS | Status: AC
  Administered 2023-10-14 – 2023-10-15 (×6): 10 meq via INTRAVENOUS
  Filled 2023-10-14 (×6): qty 100

## 2023-10-14 MED ORDER — ACETAMINOPHEN 650 MG RE SUPP: 650.0000 mg | Freq: Four times a day (QID) | RECTAL | Status: DC | PRN

## 2023-10-14 MED ORDER — POTASSIUM CHLORIDE CRYS ER 20 MEQ PO TBCR: 20.0000 meq | EXTENDED_RELEASE_TABLET | Freq: Once | ORAL | Status: DC

## 2023-10-14 MED ORDER — ENOXAPARIN SODIUM 40 MG/0.4ML IJ SOSY
40.0000 mg | PREFILLED_SYRINGE | INTRAMUSCULAR | Status: DC
  Administered 2023-10-15: 40 mg via SUBCUTANEOUS
  Filled 2023-10-14: qty 0.4

## 2023-10-14 MED ORDER — LACTATED RINGERS IV BOLUS
1000.0000 mL | Freq: Once | INTRAVENOUS | Status: AC
  Administered 2023-10-14: 1000 mL via INTRAVENOUS

## 2023-10-14 MED ORDER — PROCHLORPERAZINE EDISYLATE 10 MG/2ML IJ SOLN: 10.0000 mg | Freq: Four times a day (QID) | INTRAMUSCULAR | Status: DC | PRN

## 2023-10-14 MED ORDER — POTASSIUM CHLORIDE 10 MEQ/100ML IV SOLN: 10.0000 meq | INTRAVENOUS | Status: DC

## 2023-10-14 MED ORDER — SODIUM CHLORIDE 0.9% FLUSH
3.0000 mL | Freq: Two times a day (BID) | INTRAVENOUS | Status: DC
  Administered 2023-10-14 – 2023-10-16 (×4): 3 mL via INTRAVENOUS

## 2023-10-14 NOTE — H&P (Signed)
 History and Physical       Jenna Wilson ZOX:[id number] DOB: Jan 31, 1991 DOA: 10/14/2023    PCP: Patient, No Pcp Per   Patient coming from: Motel    I have personally briefly reviewed patient's old medical records in [facility]    Chief Complaint: Overdose    HPI:  Jenna Wilson is a 33 y.o. female with medical history significant for substance use disorder, depression/anxiety on chronic Klonopin who presented to the ED via EMS for suspected intentional overdose.  Patient is unable to provide history due to hypersomnolence and is otherwise supplemented by EDP and chart review.    Patient was brought to the ED via EMS from parking lot of [employer].  Per EMS report, patient sent a text to a friend that stated she was overdosing because she was suicidal.  She initially said that she took Klonopin and Seroquel  but per ED documentation has had inconsistent history regarding what she took.    Patient was placed under IVC by the initial ED provider due to concern for suicidal intent.    PDMP reviewed.  She is on long-term clonazepam, last prescription written and filled: Clonazepam 1 mg #90 tablets on 09/29/2023.    She remains hypersomnolent/obtunded after monitoring in the ED.  Currently protecting airway and not hypoxic.    ED Course  Labs/Imaging on admission: I have personally reviewed following labs and imaging studies.    Initial vitals showed BP 110/65, pulse 110, RR 22, temp 97.7 F, SpO2 92% on room air.    Labs showed sodium 135, potassium 2.8, bicarb 21, BUN 13, creatinine 0.81, serum glucose 95, AST 29, ALT 26, alk phos 80, total bilirubin 1.5, WBC 8.4, hemoglobin 15.3, platelets 341,000.    Serum ethanol, acetaminophen , and salicylate levels undetectable.  Lithium level <0.06.    UDS positive for cocaine and benzodiazepines.  Serum hCG negative.    Patient was given 1 L LR.  Patient was placed under IVC by ED provider.  Patient remained somnolent/obtunded after 6 hours of monitoring therefore medical admission  was requested for airway monitoring and medical clearance.  The hospitalist service was consulted to admit.    Review of Systems: All systems reviewed and are negative except as documented in history of present illness above.      Past Medical History:   Diagnosis Date   . Anxiety    . Depression    . Hx of drug abuse (HCC)     past heroin addict       Past Surgical History:   Procedure Laterality Date   . NO PAST SURGERIES         Social History:   reports that she has been smoking cigarettes. She has a 14 pack-year smoking history. She has never used smokeless tobacco. She reports that she does not drink alcohol and does not use drugs.    No Known Allergies    Family History   Problem Relation Age of Onset   . Alcohol abuse Neg Hx    . Arthritis Neg Hx    . Asthma Neg Hx    . Birth defects Neg Hx    . Cancer Neg Hx    . COPD Neg Hx    . Depression Neg Hx    . Diabetes Neg Hx    . Drug abuse Neg Hx    . Early death Neg Hx    . Hearing loss Neg Hx    . Heart disease Neg  Hx    . Hyperlipidemia Neg Hx    . Hypertension Neg Hx    . Kidney disease Neg Hx    . Learning disabilities Neg Hx    . Mental illness Neg Hx    . Mental retardation Neg Hx    . Miscarriages / Stillbirths Neg Hx    . Stroke Neg Hx    . Vision loss Neg Hx    . Varicose Veins Neg Hx          Prior to Admission medications    Medication Sig Start Date End Date Taking? Authorizing Provider   buprenorphine (SUBUTEX) 8 MG SUBL SL tablet Place 8 mg under the tongue daily.  03/02/18   [provider]   busPIRone (BUSPAR) 10 MG tablet Take 1 tablet (10 mg total) by mouth 3 (three) times daily. 05/08/17   Eksir, Chalmer [place], MD   clonazePAM (KLONOPIN) 1 MG tablet Take 1 mg by mouth 3 (three) times daily as needed for anxiety.    [provider]   cyclobenzaprine (FLEXERIL) 10 MG tablet Take 10 mg by mouth 3 (three) times daily as needed for muscle spasms.  12/16/17   [provider]   cyclobenzaprine (FLEXERIL) 10 MG tablet Take 1  tablet (10 mg total) by mouth at bedtime. 03/23/18   Street, [place], PA-C   cyclobenzaprine (FLEXERIL) 5 MG tablet Take 2 tablets (10 mg total) by mouth at bedtime.  Patient not taking: Reported on 03/23/2018 05/08/17   Lana Pina, MD   HYDROcodone-acetaminophen  (NORCO) 5-325 MG tablet Take 1 tablet by mouth every 6 (six) hours as needed for moderate pain. 10/17/22   Small, Brooke L, PA   lithium 600 MG capsule Take 600 mg by mouth daily.  03/02/18   [provider]   lithium carbonate (LITHOBID) 300 MG CR tablet Take 1 tablet (300 mg total) by mouth daily. Takes 2 tablets by mouth in the morning and 1 tablet by mouth at bedtime.  Patient not taking: Reported on 03/23/2018 05/08/17   Lana Pina, MD   metFORMIN (GLUCOPHAGE) 500 MG tablet Take 1 tablet (500 mg total) by mouth daily with breakfast.  Patient not taking: Reported on 03/23/2018 05/08/17 05/08/18  Lana Pina, MD   prazosin  (MINIPRESS ) 2 MG capsule Take 1 capsule (2 mg total) by mouth 2 (two) times daily. 05/08/17   Eksir, Chalmer Columbia, MD   propranolol  (INDERAL ) 10 MG tablet Take 10 mg by mouth 2 (two) times daily.    [provider]   QUEtiapine  (SEROQUEL ) 200 MG tablet Take 1 tablet (200 mg total) by mouth at bedtime.  Patient not taking: Reported on 03/23/2018 05/08/17   Lana Pina, MD   QUEtiapine  (SEROQUEL ) 200 MG tablet Take 1 tablet (200 mg total) by mouth at bedtime. 03/23/18   Street, [place], PA-C   QUEtiapine  Fumarate (SEROQUEL  XR) 150 MG 24 hr tablet Take 150 mg by mouth at bedtime.  01/20/18   [provider]       Physical Exam:  Vitals:    10/14/23 2130 10/14/23 2145 10/14/23 2200 10/14/23 2215   BP: 97/73 94/62 102/68 99/60   Pulse: (!) 101 (!) 102 (!) 101 (!) 101   Resp: 16 16 16 17    Temp:       TempSrc:       SpO2: 95% 95% 97% 95%   Weight:       Height:  Exam limited due to hypersomnolence/obtunded patient  Constitutional: Obtunded, briefly opens eyes and grimaces to  sternal rub  Eyes: PERRL, lids and conjunctivae normal  ENMT: Mucous membranes are dry. Posterior pharynx clear of any exudate or lesions.Normal dentition.   Neck: normal, supple, no masses.  Respiratory: clear to auscultation anteriorly. Normal respiratory effort. No accessory muscle use.   Cardiovascular: Mild tachycardia, no murmurs / rubs / gallops. No extremity edema. 2+ pedal pulses.  Abdomen: no obvious tenderness elicited with palpation, no masses palpated.   Musculoskeletal: no clubbing / cyanosis. No joint deformity upper and lower extremities.  Withdraws extremities to noxious stimuli  Skin: no rashes, lesions, ulcers. No induration  Neurologic: Obtunded.  Withdraws extremities to noxious stimuli  Psychiatric: Obtunded    EKG: Personally reviewed. Sinus tachycardia, rate 119, QTc 508, no acute ischemic changes.    Assessment/Plan  Principal Problem:    Intentional benzodiazepine overdose, initial encounter (HCC)  Active Problems:    Hypokalemia    Substance use disorder    Depression with anxiety     Teletha Mcbath is a 33 y.o. female with medical history significant for substance use disorder, depression/anxiety on chronic Klonopin who is admitted with suspected intentional overdose of clonazepam.    Assessment and Plan:  Suspected intentional overdose of clonazepam with self-harm intent:  Remains obtunded but currently protecting airway, not hypoxic.  There was question of Seroquel  intoxication as well.  -Admit to stepdown, monitor airway/respirations  -Placed under IVC by EDP, continue sitter and SI precautions  -Consulted psychiatry in a.m., order placed    Hypokalemia:  IV supplement ordered.  Magnesium  is 2.5.  Recheck labs in AM.    Depression/anxiety:  Holding clonazepam, PDMP reviewed this has been filled regularly.  Awaiting medication reconciliation regarding her other meds.  Lithium level undetectable.  Psychiatry consult as above.    Substance use disorder:  UDS positive for cocaine in addition  to benzodiazepines.     DVT prophylaxis: enoxaparin (LOVENOX) injection 40 mg Start: 10/15/23 2200  SCDs Start: 10/14/23 2208  Code Status: Full code  Family Communication: None present on admission  Disposition Plan: Pending clinical progress  Consults called: Psychiatry consult pending  Severity of Illness:  The appropriate patient status for this patient is OBSERVATION. Observation status is judged to be reasonable and necessary in order to provide the required intensity of service to ensure the patient's safety. The patient's presenting symptoms, physical exam findings, and initial radiographic and laboratory data in the context of their medical condition is felt to place them at decreased risk for further clinical deterioration. Furthermore, it is anticipated that the patient will be medically stable for discharge from the hospital within 2 midnights of admission.    Edith Gores MD  [facility]    If 7PM-7AM, please contact night-coverage  [email or web address]    10/14/2023, 10:35 PM

## 2023-10-14 NOTE — ED Notes (Signed)
 Pt dressed into hospital gown. All personal belongings' collected and placed in 2 bags. Bags LABELED and placed in cabinet 16-18. Patient has not been wanded.   Sitter at bedside

## 2023-10-14 NOTE — ED Triage Notes (Signed)
 Pt arrives via EMS from parking lot of [address] [employer] with reports of possibly taking 4 bottles of klonopin but no bottles found by EMS. Pt denies to triage RN taking any, states she only took 1 Seroquel. Pt reports depression.

## 2023-10-14 NOTE — ED Notes (Signed)
 Pt moving on stretcher like she is trying to get up but then lays back down & falls asleep. Pt mumbling verbal responses to questions by staff. Pt is redirectable at this time.

## 2023-10-14 NOTE — ED Provider Notes (Signed)
 [facility] EMERGENCY DEPARTMENT AT [facility]  Provider Note      CSN: [id number]  Arrival date & time: 10/14/23  1430         History    Chief Complaint   Patient presents with   . Suicidal       Jenna Wilson is a 33 y.o. female.    HPI  33 year old female brought in by EMS with report of overdose.  Per EMS report, patient sent a text to a friend that stated she was overdosing because she was suicidal.  Police responded to patient in a car at a motel.  She initially said she took Klonopin and Seroquel .  She has apparently changed what she says she took multiple times.       Home Medications  Prior to Admission medications    Medication Sig Start Date End Date Taking? Authorizing Provider   buprenorphine (SUBUTEX) 8 MG SUBL SL tablet Place 8 mg under the tongue daily.  03/02/18   [provider]   busPIRone (BUSPAR) 10 MG tablet Take 1 tablet (10 mg total) by mouth 3 (three) times daily. 05/08/17   Eksir, Chalmer Columbia, MD   clonazePAM (KLONOPIN) 1 MG tablet Take 1 mg by mouth 3 (three) times daily as needed for anxiety.    [provider]   cyclobenzaprine (FLEXERIL) 10 MG tablet Take 10 mg by mouth 3 (three) times daily as needed for muscle spasms.  12/16/17   [provider]   cyclobenzaprine (FLEXERIL) 10 MG tablet Take 1 tablet (10 mg total) by mouth at bedtime. 03/23/18   Street, [place], PA-C   cyclobenzaprine (FLEXERIL) 5 MG tablet Take 2 tablets (10 mg total) by mouth at bedtime.  Patient not taking: Reported on 03/23/2018 05/08/17   Lana Pina, MD   HYDROcodone-acetaminophen  [facility]) 5-325 MG tablet Take 1 tablet by mouth every 6 (six) hours as needed for moderate pain. 10/17/22   Small, Brooke L, PA   lithium 600 MG capsule Take 600 mg by mouth daily.  03/02/18   [provider]   lithium carbonate (LITHOBID) 300 MG CR tablet Take 1 tablet (300 mg total) by mouth daily. Takes 2 tablets by mouth in the morning and 1 tablet by mouth at bedtime.  Patient not  taking: Reported on 03/23/2018 05/08/17   Lana Pina, MD   metFORMIN (GLUCOPHAGE) 500 MG tablet Take 1 tablet (500 mg total) by mouth daily with breakfast.  Patient not taking: Reported on 03/23/2018 05/08/17 05/08/18  Lana Pina, MD   prazosin  (MINIPRESS ) 2 MG capsule Take 1 capsule (2 mg total) by mouth 2 (two) times daily. 05/08/17   Eksir, Chalmer Columbia, MD   propranolol  (INDERAL ) 10 MG tablet Take 10 mg by mouth 2 (two) times daily.    [provider]   QUEtiapine  (SEROQUEL ) 200 MG tablet Take 1 tablet (200 mg total) by mouth at bedtime.  Patient not taking: Reported on 03/23/2018 05/08/17   Lana Pina, MD   QUEtiapine  (SEROQUEL ) 200 MG tablet Take 1 tablet (200 mg total) by mouth at bedtime. 03/23/18   Street, [place], PA-C   QUEtiapine  Fumarate (SEROQUEL  XR) 150 MG 24 hr tablet Take 150 mg by mouth at bedtime.  01/20/18   [provider]         Allergies     Patient has no known allergies.      Review of Systems    Review of Systems  Physical Exam  Updated Vital Signs  BP 110/65   Pulse (!) 110   Temp 97.7 F (36.5 C) (Oral)   Resp (!) 22   Ht 1.575 m (5\' 2" )   Wt 81.6 kg   SpO2 92%   BMI 32.90 kg/m   Physical Exam  Vitals reviewed.   HENT:      Head: Normocephalic.      Right Ear: External ear normal.      Left Ear: External ear normal.      Nose: Nose normal.      Mouth/Throat:      Pharynx: Oropharynx is clear.   Eyes:      Pupils: Pupils are equal, round, and reactive to light.   Cardiovascular:      Rate and Rhythm: Regular rhythm. Tachycardia present.      Pulses: Normal pulses.   Pulmonary:      Effort: Pulmonary effort is normal.      Breath sounds: Normal breath sounds.   Abdominal:      General: Abdomen is flat.      Palpations: Abdomen is soft.   Musculoskeletal:         General: Normal range of motion.      Cervical back: Normal range of motion.   Skin:     General: Skin is warm.      Capillary Refill: Capillary refill takes less than 2  seconds.   Neurological:      General: No focal deficit present.      Mental Status: She is alert.      Comments: Slurring of words  Noncooperative with nursing attempting to place IV   Psychiatric:         Mood and Affect: Mood normal.         ED Results / Procedures / Treatments    Labs  (all labs ordered are listed, but only abnormal results are displayed)  Labs Reviewed   CBC WITH DIFFERENTIAL/PLATELET - Abnormal; Notable for the following components:       Result Value    Hemoglobin 15.3 (*)     All other components within normal limits   COMPREHENSIVE METABOLIC PANEL   SALICYLATE LEVEL   ACETAMINOPHEN  LEVEL   ETHANOL   RAPID URINE DRUG SCREEN, HOSP PERFORMED   HCG, SERUM, QUALITATIVE   CBG MONITORING, ED       EKG  EKG Interpretation  Date/Time:  Wednesday October 14 2023 14:28:32 EST  Ventricular Rate:  119  PR Interval:  142  QRS Duration:  101  QT Interval:  361  QTC Calculation: 508  R Axis:   99    Text Interpretation: Sinus tachycardia Borderline right axis deviation Borderline T abnormalities, anterior leads Prolonged QT interval Confirmed by Jerald Molly [phone]) on 10/14/2023 3:09:11 PM    Radiology  No results found.    Procedures  Procedures       Medications Ordered in ED  Medications   lactated ringers  bolus 1,000 mL (1,000 mLs Intravenous New Bag/Given 10/14/23 1504)       ED Course/ Medical Decision Making/ A&P                                     Medical Decision Making  Amount and/or Complexity of Data Reviewed  Labs: ordered.      33 year old female presents today with altered mental status and reports of suicidal ideation.  Differential diagnosis includes but not limited to toxic ingestion, psychiatric disorder, metabolic abnormalities including electrolyte abnormalities and hypoglycemia.  Patient will be evaluated for pregnancy.  Care discussed with Dr. Gordon Latus who will follow up and dispo as appropriate                Final Clinical Impression(s) / ED Diagnoses  Final diagnoses:   Suicide  attempt [facility])   Intentional overdose, initial encounter [facility])       Rx / DC Orders  ED Discharge Orders       None               Auston Blush, MD  10/14/23 [phone]

## 2023-10-14 NOTE — ED Notes (Signed)
 Pt will not wake up so I can get her temp and trying to get it axillary did not work

## 2023-10-14 NOTE — ED Provider Notes (Signed)
 33 yo female presenting with suspected overdose, potentially klonopin with empty bottles on scene    Patient had reported seroquel consumption as well    Awake and responsive here  Placed under IVC by ED provider - concern for SI, as patient reportedly had sent text to friend threatening overdose for SI    PDMP reviewed - last clonazepam prescription filled 90 tablets 1 mg on 09/29/23      Physical Exam   BP 100/60   Pulse (!) 107   Temp (!) 97.4 F (36.3 C) (Oral)   Resp 15   Ht 5\' 2"  (1.575 m)   Wt 81.6 kg   SpO2 95%   BMI 32.90 kg/m     Physical Exam    Procedures   Procedures    ED Course / MDM     Clinical Course as of 10/14/23 2151   Wed Oct 14, 2023   1909 Vitals remain stable, patient sleeping but arousable [MT]   2147 I spoke to [employer] who reports continued supportive care for potential Klonopin and seroquel overdose - up to 24 hour monitoring - target K to 4 and Mag to 2. [MT]   2151 Pt admitted to Dr Allena Katz for stepdown bed, monitoring of airway given her prolonged observation period of a failure to improve her mentation.  At this time she is still maintaining her airway and does not have hypoxia, therefore is not requiring intubation.  But she would benefit from close monitoring. [MT]      Clinical Course User Index  [MT] Trifan, Kermit Balo, MD     Medical Decision Making  Amount and/or Complexity of Data Reviewed  Labs: ordered.    Risk  Prescription drug management.  Decision regarding hospitalization.      Pending medical clearance, monitoring 6 hours  TTS consult afterwards if no inpatient admission             Terald Sleeper, MD  10/14/23 2151

## 2023-10-14 NOTE — ED Notes (Signed)
 IVC [id number]

## 2023-10-14 NOTE — Hospital Course (Signed)
 Jenna Wilson is a 33 y.o. female with medical history significant for substance use disorder, depression/anxiety on chronic Klonopin who is admitted with suspected intentional overdose of clonazepam.

## 2023-10-15 DIAGNOSIS — G9341 Metabolic encephalopathy: Secondary | ICD-10-CM | POA: Diagnosis present

## 2023-10-15 DIAGNOSIS — T50902A Poisoning by unspecified drugs, medicaments and biological substances, intentional self-harm, initial encounter: Secondary | ICD-10-CM | POA: Diagnosis not present

## 2023-10-15 DIAGNOSIS — E876 Hypokalemia: Secondary | ICD-10-CM | POA: Diagnosis not present

## 2023-10-15 DIAGNOSIS — G4710 Hypersomnia, unspecified: Secondary | ICD-10-CM | POA: Diagnosis present

## 2023-10-15 DIAGNOSIS — Z9152 Personal history of nonsuicidal self-harm: Secondary | ICD-10-CM | POA: Diagnosis not present

## 2023-10-15 DIAGNOSIS — F319 Bipolar disorder, unspecified: Secondary | ICD-10-CM | POA: Diagnosis present

## 2023-10-15 DIAGNOSIS — Z66 Do not resuscitate: Secondary | ICD-10-CM | POA: Diagnosis present

## 2023-10-15 DIAGNOSIS — F1410 Cocaine abuse, uncomplicated: Secondary | ICD-10-CM | POA: Diagnosis present

## 2023-10-15 DIAGNOSIS — G4700 Insomnia, unspecified: Secondary | ICD-10-CM | POA: Diagnosis present

## 2023-10-15 DIAGNOSIS — G9340 Encephalopathy, unspecified: Secondary | ICD-10-CM

## 2023-10-15 DIAGNOSIS — F314 Bipolar disorder, current episode depressed, severe, without psychotic features: Secondary | ICD-10-CM | POA: Diagnosis not present

## 2023-10-15 DIAGNOSIS — Z79899 Other long term (current) drug therapy: Secondary | ICD-10-CM | POA: Diagnosis not present

## 2023-10-15 DIAGNOSIS — I952 Hypotension due to drugs: Secondary | ICD-10-CM | POA: Diagnosis present

## 2023-10-15 DIAGNOSIS — Z5902 Unsheltered homelessness: Secondary | ICD-10-CM | POA: Diagnosis not present

## 2023-10-15 DIAGNOSIS — I10 Essential (primary) hypertension: Secondary | ICD-10-CM | POA: Diagnosis present

## 2023-10-15 DIAGNOSIS — F411 Generalized anxiety disorder: Secondary | ICD-10-CM | POA: Diagnosis present

## 2023-10-15 DIAGNOSIS — T424X2A Poisoning by benzodiazepines, intentional self-harm, initial encounter: Secondary | ICD-10-CM | POA: Diagnosis not present

## 2023-10-15 DIAGNOSIS — F17210 Nicotine dependence, cigarettes, uncomplicated: Secondary | ICD-10-CM | POA: Diagnosis present

## 2023-10-15 LAB — COMPREHENSIVE METABOLIC PANEL
ALT: 21 U/L (ref 0–44)
AST: 22 U/L (ref 15–41)
Albumin: 3.4 g/dL — ABNORMAL LOW (ref 3.5–5.0)
Alkaline Phosphatase: 70 U/L (ref 38–126)
Anion gap: 7 (ref 5–15)
BUN: 11 mg/dL (ref 6–20)
CO2: 23 mmol/L (ref 22–32)
Calcium: 8.3 mg/dL — ABNORMAL LOW (ref 8.9–10.3)
Chloride: 106 mmol/L (ref 98–111)
Creatinine, Ser: 0.7 mg/dL (ref 0.44–1.00)
GFR, Estimated: >60 mL/min (ref >60)
Glucose, Bld: 95 mg/dL (ref 70–99)
Potassium: 3.3 mmol/L — ABNORMAL LOW (ref 3.5–5.1)
Sodium: 136 mmol/L (ref 135–145)
Total Bilirubin: 1.2 mg/dL (ref 0.0–1.2)
Total Protein: 6.3 g/dL — ABNORMAL LOW (ref 6.5–8.1)

## 2023-10-15 LAB — LACTIC ACID, PLASMA
Lactic Acid, Venous: 0.8 mmol/L (ref 0.5–1.9)
Lactic Acid, Venous: 0.7 mmol/L (ref 0.5–1.9)

## 2023-10-15 LAB — CBC
HCT: 40.3 % (ref 36.0–46.0)
Hemoglobin: 13.6 g/dL (ref 12.0–15.0)
MCH: 31.1 pg (ref 26.0–34.0)
MCHC: 33.7 g/dL (ref 30.0–36.0)
MCV: 92.2 fL (ref 80.0–100.0)
Platelets: 315 10*3/uL (ref 150–400)
RBC: 4.37 MIL/uL (ref 3.87–5.11)
RDW: 12.8 % (ref 11.5–15.5)
WBC: 12.8 10*3/uL — ABNORMAL HIGH (ref 4.0–10.5)
nRBC: 0 % (ref 0.0–0.2)

## 2023-10-15 LAB — VITAMIN B12: Vitamin B-12: 369 pg/mL (ref 180–914)

## 2023-10-15 LAB — GLUCOSE, CAPILLARY
Glucose-Capillary: 95 mg/dL (ref 70–99)
Glucose-Capillary: 88 mg/dL (ref 70–99)
Glucose-Capillary: 96 mg/dL (ref 70–99)

## 2023-10-15 LAB — HIV ANTIBODY (ROUTINE TESTING W REFLEX): HIV Screen 4th Generation wRfx: NONREACTIVE

## 2023-10-15 LAB — TSH: TSH: 1.764 u[IU]/mL (ref 0.350–4.500)

## 2023-10-15 LAB — MRSA NEXT GEN BY PCR, NASAL: MRSA by PCR Next Gen: NOT DETECTED

## 2023-10-15 LAB — LIPID PANEL
Cholesterol: 119 mg/dL (ref 0–200)
HDL: 48 mg/dL (ref >40)
LDL Cholesterol: 63 mg/dL (ref 0–99)
Total CHOL/HDL Ratio: 2.5 {ratio}
Triglycerides: 40 mg/dL (ref <150)
VLDL: 8 mg/dL (ref 0–40)

## 2023-10-15 LAB — ACETAMINOPHEN LEVEL: Acetaminophen (Tylenol), Serum: <10 ug/mL — ABNORMAL LOW (ref 10–30)

## 2023-10-15 MED ORDER — LACTATED RINGERS IV BOLUS
1000.0000 mL | Freq: Once | INTRAVENOUS | Status: AC
  Administered 2023-10-15: 1000 mL via INTRAVENOUS

## 2023-10-15 MED ORDER — LOPERAMIDE HCL 2 MG PO CAPS
2.0000 mg | ORAL_CAPSULE | ORAL | Status: DC | PRN
  Administered 2023-10-15: 4 mg via ORAL
  Filled 2023-10-15: qty 2

## 2023-10-15 MED ORDER — HYDROXYZINE HCL 25 MG PO TABS
25.0000 mg | ORAL_TABLET | Freq: Four times a day (QID) | ORAL | Status: DC | PRN
  Administered 2023-10-15 – 2023-10-16 (×2): 25 mg via ORAL
  Filled 2023-10-15 (×2): qty 1

## 2023-10-15 MED ORDER — SODIUM CHLORIDE 0.9 % IV BOLUS
500.0000 mL | Freq: Once | INTRAVENOUS | Status: AC
Start: 2023-10-15 — End: 2023-10-15
  Administered 2023-10-15: 500 mL via INTRAVENOUS

## 2023-10-15 MED ORDER — LORAZEPAM 1 MG PO TABS
1.0000 mg | ORAL_TABLET | Freq: Four times a day (QID) | ORAL | Status: DC | PRN
  Administered 2023-10-16: 1 mg via ORAL
  Filled 2023-10-15: qty 1

## 2023-10-15 MED ORDER — LORAZEPAM 1 MG PO TABS
1.0000 mg | ORAL_TABLET | ORAL | Status: AC | PRN
  Administered 2023-10-15: 1 mg via ORAL
  Filled 2023-10-15: qty 1

## 2023-10-15 MED ORDER — DEXTROSE-SODIUM CHLORIDE 5-0.9 % IV SOLN: INTRAVENOUS | Status: AC

## 2023-10-15 MED ORDER — THIAMINE MONONITRATE 100 MG PO TABS
100.0000 mg | ORAL_TABLET | Freq: Every day | ORAL | Status: DC
Start: 2023-10-16 — End: 2023-10-16
  Administered 2023-10-16: 100 mg via ORAL
  Filled 2023-10-15: qty 1

## 2023-10-15 MED ORDER — ZIPRASIDONE MESYLATE 20 MG IM SOLR
20.0000 mg | Freq: Two times a day (BID) | INTRAMUSCULAR | Status: DC | PRN
  Administered 2023-10-15: 20 mg via INTRAMUSCULAR
  Filled 2023-10-15 (×2): qty 20

## 2023-10-15 MED ORDER — STERILE WATER FOR INJECTION IJ SOLN
INTRAMUSCULAR | Status: AC
  Administered 2023-10-15: 10 mL
  Filled 2023-10-15: qty 10

## 2023-10-15 MED ORDER — SODIUM CHLORIDE 0.9 % IV BOLUS
1000.0000 mL | Freq: Once | INTRAVENOUS | Status: AC
  Administered 2023-10-15: 1000 mL via INTRAVENOUS

## 2023-10-15 MED ORDER — THIAMINE HCL 100 MG/ML IJ SOLN
100.0000 mg | Freq: Once | INTRAMUSCULAR | Status: AC
  Administered 2023-10-15: 100 mg via INTRAMUSCULAR
  Filled 2023-10-15: qty 2

## 2023-10-15 MED ORDER — ONDANSETRON 4 MG PO TBDP: 4.0000 mg | ORAL_TABLET | Freq: Four times a day (QID) | ORAL | Status: DC | PRN

## 2023-10-15 MED ORDER — POTASSIUM CHLORIDE 10 MEQ/100ML IV SOLN
10.0000 meq | INTRAVENOUS | Status: AC
  Administered 2023-10-15 (×3): 10 meq via INTRAVENOUS
  Filled 2023-10-15 (×3): qty 100

## 2023-10-15 MED ORDER — CHLORHEXIDINE GLUCONATE CLOTH 2 % EX PADS
6.0000 | MEDICATED_PAD | Freq: Every day | CUTANEOUS | Status: DC
Start: 2023-10-15 — End: 2023-10-16
  Administered 2023-10-15: 6 via TOPICAL

## 2023-10-15 MED ORDER — SODIUM CHLORIDE 0.9 % IV BOLUS
500.0000 mL | Freq: Once | INTRAVENOUS | Status: AC
  Administered 2023-10-15: 500 mL via INTRAVENOUS

## 2023-10-15 MED ORDER — ADULT MULTIVITAMIN W/MINERALS CH
1.0000 | ORAL_TABLET | Freq: Every day | ORAL | Status: DC
  Administered 2023-10-16: 1 via ORAL
  Filled 2023-10-15: qty 1

## 2023-10-15 NOTE — ED Notes (Signed)
 ED TO INPATIENT HANDOFF REPORT    ED Nurse Name and Phone #: Cruzita Dopp  Name/Age/Gender  Jenna Wilson  33 y.o.  female  Room/Bed: RESB/RESB    Code Status    Code Status: Full Code    Home/SNF/Other  Home  Patient oriented to: self  Is this baseline? No     Triage Complete: Triage complete   Chief Complaint  Intentional benzodiazepine overdose, initial encounter [facility]) [T42.4X2A]    Triage Note  Pt arrives via EMS from parking lot of [employer] with reports of possibly taking 4 bottles of klonopin but no bottles found by EMS. Pt denies to triage RN taking any, states she only took 1 Seroquel . Pt reports depression.      Allergies  No Known Allergies    Level of Care/Admitting Diagnosis  ED Disposition       ED Disposition   Admit    Condition   --    Comment   Hospital Area: [facility] [[id number]]   Level of Care: Stepdown [14]   Admit to SDU based on following criteria: Severe physiological/psychological symptoms:  Any diagnosis requiring assessment & intervention at least every 4 hours on an ongoing basis to obtain desired patient outcomes including stability and rehabilitation   May place patient in observation at [facility] or [facility] if equivalent level of care is available:: No   Covid Evaluation: Asymptomatic - no recent exposure (last 10 days) testing not required   Diagnosis: Intentional benzodiazepine overdose, initial encounter [facility]) [902500]   Admitting Physician: Kenny Peals [[id number]]   Attending Physician: Kenny Peals [[id number]]                 B  Medical/Surgery History  Past Medical History:   Diagnosis Date   . Anxiety    . Depression    . Hx of drug abuse (HCC)     past heroin addict     Past Surgical History:   Procedure Laterality Date   . NO PAST SURGERIES          A  IV Location/Drains/Wounds  Patient Lines/Drains/Airways Status       Active Line/Drains/Airways       Name Placement date Placement time Site Days    Peripheral IV 10/14/23 20 G Anterior;Proximal;Right  Forearm 10/14/23  1440  Forearm  1                    Intake/Output Last 24 hours    Intake/Output Summary (Last 24 hours) at 10/15/2023 0004  Last data filed at 10/14/2023 1912  Gross per 24 hour   Intake 1000 ml   Output --   Net 1000 ml       Labs/Imaging  Results for orders placed or performed during the hospital encounter of 10/14/23 (from the past 48 hours)   Comprehensive metabolic panel     Status: Abnormal    Collection Time: 10/14/23  2:40 PM   Result Value Ref Range    Sodium 135 135 - 145 mmol/L    Potassium 2.8 (L) 3.5 - 5.1 mmol/L    Chloride 102 98 - 111 mmol/L    CO2 21 (L) 22 - 32 mmol/L    Glucose, Bld 95 70 - 99 mg/dL     Comment: Glucose reference range applies only to samples taken after fasting for at least 8 hours.    BUN 13 6 - 20 mg/dL  Creatinine, Ser 0.81 0.44 - 1.00 mg/dL    Calcium 8.8 (L) 8.9 - 10.3 mg/dL    Total Protein 7.1 6.5 - 8.1 g/dL    Albumin 3.9 3.5 - 5.0 g/dL    AST 29 15 - 41 U/L    ALT 26 0 - 44 U/L    Alkaline Phosphatase 80 38 - 126 U/L    Total Bilirubin 1.5 (H) 0.0 - 1.2 mg/dL    GFR, Estimated >42 >59 mL/min     Comment: (NOTE)  Calculated using the CKD-EPI Creatinine Equation (2021)      Anion gap 12 5 - 15     Comment: Performed at [facility], [address], [place], [place] [address]   Salicylate level     Status: Abnormal    Collection Time: 10/14/23  2:40 PM   Result Value Ref Range    Salicylate Lvl <7.0 (L) 7.0 - 30.0 mg/dL     Comment: Performed at [facility], [address] [place], [place] [address]   Acetaminophen  level     Status: Abnormal    Collection Time: 10/14/23  2:40 PM   Result Value Ref Range    Acetaminophen  (Tylenol ), Serum <10 (L) 10 - 30 ug/mL     Comment: (NOTE)  Therapeutic concentrations vary significantly. A range of 10-30 ug/mL   may be an effective concentration for many patients. However, some   are best treated at concentrations outside of this range.  Acetaminophen  concentrations >150 ug/mL at  4 hours after ingestion   and >50 ug/mL at 12 hours after ingestion are often associated with   toxic reactions.    Performed at [facility], [address],  [place], [place] [address]     Ethanol     Status: None    Collection Time: 10/14/23  2:40 PM   Result Value Ref Range    Alcohol, Ethyl (B) <10 <10 mg/dL     Comment: (NOTE)  Lowest detectable limit for serum alcohol is 10 mg/dL.    For medical purposes only.  Performed at [facility], [address]  [place], [place] [address]     CBC WITH DIFFERENTIAL     Status: Abnormal    Collection Time: 10/14/23  2:40 PM   Result Value Ref Range    WBC 8.4 4.0 - 10.5 K/uL    RBC 4.92 3.87 - 5.11 MIL/uL    Hemoglobin 15.3 (H) 12.0 - 15.0 g/dL    HCT 06.3 01.6 - 01.0 %    MCV 90.0 80.0 - 100.0 fL    MCH 31.1 26.0 - 34.0 pg    MCHC 34.5 30.0 - 36.0 g/dL    RDW 93.2 35.5 - 73.2 %    Platelets 341 150 - 400 K/uL    nRBC 0.0 0.0 - 0.2 %    Neutrophils Relative % 60 %    Neutro Abs 5.0 1.7 - 7.7 K/uL    Lymphocytes Relative 26 %    Lymphs Abs 2.2 0.7 - 4.0 K/uL    Monocytes Relative 11 %    Monocytes Absolute 0.9 0.1 - 1.0 K/uL    Eosinophils Relative 2 %    Eosinophils Absolute 0.2 0.0 - 0.5 K/uL    Basophils Relative 1 %    Basophils Absolute 0.1 0.0 - 0.1 K/uL    Immature Granulocytes 0 %    Abs Immature Granulocytes 0.03 0.00 - 0.07 K/uL  Comment: Performed at [facility], [address] [place], [place] [address]   hCG, serum, qualitative     Status: None    Collection Time: 10/14/23  2:40 PM   Result Value Ref Range    Preg, Serum NEGATIVE NEGATIVE     Comment:         THE SENSITIVITY OF THIS  METHODOLOGY IS >10 mIU/mL.  Performed at [facility], [address] [place], [place] [address]     Lithium level     Status: Abnormal    Collection Time: 10/14/23  2:40 PM   Result Value Ref Range    Lithium Lvl <0.06 (L) 0.60 - 1.20 mmol/L     Comment: Performed at [facility], [address] [place], [place] [address]   Magnesium      Status: Abnormal    Collection Time: 10/14/23  2:40 PM   Result Value Ref Range    Magnesium  2.5 (H) 1.7 - 2.4 mg/dL     Comment: Performed at [facility], [address] [place], [place] [address]   CBG monitoring, ED     Status: None    Collection Time: 10/14/23  2:47 PM   Result Value Ref Range    Glucose-Capillary 91 70 - 99 mg/dL     Comment: Glucose reference range applies only to samples taken after fasting for at least 8 hours.   Urine rapid drug screen (hosp performed)     Status: Abnormal    Collection Time: 10/14/23  2:47 PM   Result Value Ref Range    Opiates NONE DETECTED NONE DETECTED    Cocaine POSITIVE (A) NONE DETECTED    Benzodiazepines POSITIVE (A) NONE DETECTED    Amphetamines NONE DETECTED NONE DETECTED    Tetrahydrocannabinol NONE DETECTED NONE DETECTED    Barbiturates NONE DETECTED NONE DETECTED     Comment: (NOTE)  DRUG SCREEN FOR MEDICAL PURPOSES  ONLY.  IF CONFIRMATION IS NEEDED  FOR ANY PURPOSE, NOTIFY LAB  WITHIN 5 DAYS.    LOWEST DETECTABLE LIMITS  FOR URINE DRUG SCREEN  Drug Class                     Cutoff (ng/mL)  Amphetamine and metabolites    1000  Barbiturate and metabolites    200  Benzodiazepine                 200  Opiates and metabolites        300  Cocaine and metabolites        300  THC                            50  Performed at [facility], [address],  [place], [place] [address]       No results found.    Pending Labs  Unresulted Labs (From admission, onward)       Start     Ordered    10/15/23 0500  HIV Antibody (routine testing w rflx)  (HIV Antibody (Routine testing w reflex) panel)  Tomorrow morning,   R         10/14/23 2208    10/15/23 0500  CBC  Tomorrow morning,   R         10/14/23 2208    10/15/23 0500  Comprehensive metabolic panel  Tomorrow morning,   R  10/14/23 2208    10/15/23 0003  MRSA Next Gen by PCR, Nasal  Once,   R         10/15/23  0002                    Vitals/Pain  Today's Vitals    10/14/23 2145 10/14/23 2200 10/14/23 2215 10/14/23 2346   BP: 94/62 102/68 99/60    Pulse: (!) 102 (!) 101 (!) 101    Resp: 16 16 17     Temp:    (!) 97.1 F (36.2 C)   TempSrc:    Axillary   SpO2: 95% 97% 95%    Weight:       Height:       PainSc:           Isolation Precautions  No active isolations    Medications  Medications   potassium chloride 10 mEq in 100 mL IVPB (10 mEq Intravenous New Bag/Given 10/14/23 2312)   enoxaparin (LOVENOX) injection 40 mg (has no administration in time range)   sodium chloride  flush (NS) 0.9 % injection 3 mL (3 mLs Intravenous Given 10/14/23 2316)   acetaminophen  (TYLENOL ) tablet 650 mg (has no administration in time range)     Or   acetaminophen  (TYLENOL ) suppository 650 mg (has no administration in time range)   prochlorperazine (COMPAZINE) injection 10 mg (has no administration in time range)   Chlorhexidine Gluconate Cloth 2 % PADS 6 each (has no administration in time range)   lactated ringers  bolus 1,000 mL (0 mLs Intravenous Stopped 10/14/23 1912)       Mobility  walks        Focused Assessments  See chart      R  Recommendations: See Admitting Provider Note    Report given to:     Additional Notes: see chart

## 2023-10-15 NOTE — Consult Note (Signed)
 NAME:  Jenna Wilson, MRN:  [id number], DOB:  [birth year], LOS: 0 ADMISSION DATE:  10/14/2023, CONSULTATION DATE:  10/15/23 REFERRING MD:  Madelyne , CHIEF COMPLAINT:  Hypotension    History of Present Illness:  33 yo F PMH Bipolar disorder vs depression, substance use disorder who was admitted to [facility] 1/1 for presumed intentional overdose. EMS was dispatched to a [employer] parking lot after pt sent a text to friend that she was overdosing related to SI. Initially pt reported clonazepam  and seroquel  ingestion but apparently her reports have since been variable. Lucillie was checked (has prior Rx) and was neg.   IVC'd. Given IVF in ED and admitted for ongoing monitoring given hypersomnolence.   On 1/2 pt was hypotensive. PCCM consulted in this setting   On my interview w pt she is somnolent then agitated, confused. She denies hx depression endorses hx bipolar. I asked what she was in the hospital for  and she said she was allowed to take clonazepam  3mg , three times a day and also took 2 seroquel . Would drift off each time I asked about other meds. Denied taking incr amounts of mentioned meds. Would drift off every time I asked about SI/SA.  Endorses abdominal pain beginning with the birth of her third child.   Pertinent  Medical History   Mood disorder   Significant Hospital Events: Including procedures, antibiotic start and stop dates in addition to other pertinent events   1/1 apparent intentional OD, on BZD. Admit to [facility] 1/2 hypotensive PCCM consulted   Interim History / Subjective:  Receiving 1L LR -- about 750cc have completed   Pretty mad that I woke her up. Very mad that I put her nasal cannula back on after she pulled it off.   Objective   Blood pressure (!) 96/59, pulse (!) 106, temperature 98.3 F (36.8 C), temperature source Axillary, resp. rate (!) 26, height 5' 2 (1.575 m), weight 90.3 kg, SpO2 95%, unknown if currently breastfeeding.        Intake/Output Summary (Last 24 hours) at  10/15/2023 1232 Last data filed at 10/15/2023 1200 Gross per 24 hour  Intake 4437.24 ml  Output 900 ml  Net 3537.24 ml   Filed Weights   10/14/23 1438 10/15/23 0100  Weight: 81.6 kg 90.3 kg    Examination: General: ill appearing wdwn adult F  HENT: NCAT  Lungs: Even unlabored  Cardiovascular: rr  Abdomen: soft round. No organomegaly  Extremities: no acute joint deformity.  Neuro: Somnolent. Awoke to noxious stim and was agitated. Oriented x1. Following commands  GU: defer  Resolved Hospital Problem list     Assessment & Plan:   Acute encephalopathy Drug overdose, intentional Presumed SI/SA Substance use disorder (cocaine + on UDS)  Hx depression vs bipolar disorder  Hypotension, improved  - reportedly clonazepam  (which she is Rx) and seroquel , but overall, her ingestion is fairly unclear. -med hx includes subutex, buspar , clonazepam , flexeril , norco, lithium , minipress , inderal , seroquel    -- hemodynamics would argue against significant minipress  or inderal  involvement. Li was neg. Opiates neg on UDS so unlikely norco. Qtc not prolonged which could occur w seroquel  OD, but doesn't discount ingestion.  P - 1:1 safety sitter -agree w IVC -agree w plan for psych consult -follow qtc  -cont IVF -- think her hypotension is medication related + changes related to being asleep. Improved when I woke her up. Might periodically need add'l small boluses.   -if there are periods where she is difficult to wake,  would check EtCO2. Right now don't think it is needed   PCCM will sign off. Please re-engage if we can be of further help or if pt clinical status changes   Best Practice (right click and Reselect all SmartList Selections daily)   Per primary   Labs   CBC: Recent Labs  Lab 10/14/23 1440 10/15/23 0259  WBC 8.4 12.8*  NEUTROABS 5.0  --   HGB 15.3* 13.6  HCT 44.3 40.3  MCV 90.0 92.2  PLT 341 315    Basic Metabolic Panel: Recent Labs  Lab 10/14/23 1440  10/15/23 0259  NA 135 136  K 2.8* 3.3*  CL 102 106  CO2 21* 23  GLUCOSE 95 95  BUN 13 11  CREATININE 0.81 0.70  CALCIUM 8.8* 8.3*  MG 2.5*  --    GFR: Estimated Creatinine Clearance: 103.6 mL/min (by C-G formula based on SCr of 0.7 mg/dL). Recent Labs  Lab 10/14/23 1440 10/15/23 0259 10/15/23 0851 10/15/23 1053  WBC 8.4 12.8*  --   --   LATICACIDVEN  --   --  0.8 0.7    Liver Function Tests: Recent Labs  Lab 10/14/23 1440 10/15/23 0259  AST 29 22  ALT 26 21  ALKPHOS 80 70  BILITOT 1.5* 1.2  PROT 7.1 6.3*  ALBUMIN 3.9 3.4*   No results for input(s): LIPASE, AMYLASE in the last 168 hours. No results for input(s): AMMONIA in the last 168 hours.  ABG No results found for: PHART, PCO2ART, PO2ART, HCO3, TCO2, ACIDBASEDEF, O2SAT   Coagulation Profile: No results for input(s): INR, PROTIME in the last 168 hours.  Cardiac Enzymes: No results for input(s): CKTOTAL, CKMB, CKMBINDEX, TROPONINI in the last 168 hours.  HbA1C: Hgb A1c MFr Bld  Date/Time Value Ref Range Status  05/08/2017 12:00 AM 4.9 4.8 - 5.6 % Final    Comment:             Pre-diabetes: 5.7 - 6.4          Diabetes: >6.4          Glycemic control for adults with diabetes: <7.0     CBG: Recent Labs  Lab 10/14/23 1447 10/15/23 1149  GLUCAP 91 95    Review of Systems:   Limited due to encephalopathy   Review of Systems  Respiratory:  Negative for shortness of breath.   Gastrointestinal:  Positive for abdominal pain.  Psychiatric/Behavioral:  Positive for depression. Negative for substance abuse.      Past Medical History:  She,  has a past medical history of Anxiety, Depression, and drug abuse (HCC).   Surgical History:   Past Surgical History:  Procedure Laterality Date   NO PAST SURGERIES       Social History:   reports that she has been smoking cigarettes. She has a 14 pack-year smoking history. She has never used smokeless tobacco. She reports  that she does not drink alcohol and does not use drugs.   Family History:  Her family history is negative for Alcohol abuse, Arthritis, Asthma, Birth defects, Cancer, COPD, Depression, Diabetes, Drug abuse, Early death, Hearing loss, Heart disease, Hyperlipidemia, Hypertension, Kidney disease, Learning disabilities, Mental illness, Mental retardation, Miscarriages / Stillbirths, Stroke, Vision loss, and Varicose Veins.   Allergies No Known Allergies   Home Medications  Prior to Admission medications   Medication Sig Start Date End Date Taking? Authorizing Provider  buprenorphine (SUBUTEX) 8 MG SUBL SL tablet Place 8 mg under the tongue daily.  03/02/18  [provider]  busPIRone  (BUSPAR ) 10 MG tablet Take 1 tablet (10 mg total) by mouth 3 (three) times daily. 05/08/17   Eksir, Marsa Jasper, MD  clonazePAM  (KLONOPIN ) 1 MG tablet Take 1 mg by mouth 3 (three) times daily as needed for anxiety.    [provider]  cyclobenzaprine  (FLEXERIL ) 10 MG tablet Take 10 mg by mouth 3 (three) times daily as needed for muscle spasms.  12/16/17   [provider]  cyclobenzaprine  (FLEXERIL ) 10 MG tablet Take 1 tablet (10 mg total) by mouth at bedtime. 03/23/18   Street, [place], PA-C  cyclobenzaprine  (FLEXERIL ) 5 MG tablet Take 2 tablets (10 mg total) by mouth at bedtime. Patient not taking: Reported on 03/23/2018 05/08/17   Leila Marsa Jasper, MD  HYDROcodone -acetaminophen  (NORCO) 5-325 MG tablet Take 1 tablet by mouth every 6 (six) hours as needed for moderate pain. 10/17/22   Small, Brooke L, PA  lithium  600 MG capsule Take 600 mg by mouth daily.  03/02/18   [provider]  lithium  carbonate (LITHOBID ) 300 MG CR tablet Take 1 tablet (300 mg total) by mouth daily. Takes 2 tablets by mouth in the morning and 1 tablet by mouth at bedtime. Patient not taking: Reported on 03/23/2018 05/08/17   Leila Marsa Jasper, MD  metFORMIN  (GLUCOPHAGE ) 500 MG tablet Take 1 tablet (500 mg  total) by mouth daily with breakfast. Patient not taking: Reported on 03/23/2018 05/08/17 05/08/18  Leila Marsa Jasper, MD  prazosin  (MINIPRESS ) 2 MG capsule Take 1 capsule (2 mg total) by mouth 2 (two) times daily. 05/08/17   Eksir, Marsa Jasper, MD  propranolol  (INDERAL ) 10 MG tablet Take 10 mg by mouth 2 (two) times daily.    [provider]  QUEtiapine  (SEROQUEL ) 200 MG tablet Take 1 tablet (200 mg total) by mouth at bedtime. Patient not taking: Reported on 03/23/2018 05/08/17   Leila Marsa Jasper, MD  QUEtiapine  (SEROQUEL ) 200 MG tablet Take 1 tablet (200 mg total) by mouth at bedtime. 03/23/18   Street, [place], PA-C  QUEtiapine  Fumarate (SEROQUEL  XR) 150 MG 24 hr tablet Take 150 mg by mouth at bedtime.  01/20/18   [provider]     Critical care time: na      Moderate MDM   Ronnald Gave MSN, AGACNP-BC [facility] Pulmonary/Critical Care Medicine Amion for pager  10/15/2023, 12:32 PM

## 2023-10-15 NOTE — Consult Note (Signed)
 Seen and attempted to assess. Patient remains hypersomnolent and obtunded, unable to participate in psychiatric evaluation. At this time will defer psych evaluation to tomorrow. In the interim will add ciwa protocol and ativan  detox, although I do not except she will need any at this time. Will continue to uphold IVC at this time, until additional information and collateral is obtained. Will need to seek collateral from the boyfriend (suggested text messages to reflect suicidal intent with overdose).   -Initiate CIWA and ativan  detox to monitor for benzodiazepine withdraw.  -Obtain EKG (01/03) for suggestive overdose on Seroquel , at risk for risk for QTc prolongation. EKG obtained on 10/15/23, QTc 489. Corrected [employer] 447. -Will send out urine for comprehensive tox screen. -Will start agitation protocol.  -PDMP verified long term rx for Klonopin  1mg  po TID, last filled on 12/17 #90. Prescribed by Tinnie Garret.

## 2023-10-15 NOTE — Plan of Care (Signed)
  Problem: Clinical Measurements: Goal: Respiratory complications will improve Outcome: Progressing Goal: Cardiovascular complication will be avoided Outcome: Progressing   Problem: Pain Management: Goal: General experience of comfort will improve Outcome: Progressing   Problem: Safety: Goal: Ability to remain free from injury will improve Outcome: Progressing

## 2023-10-15 NOTE — Progress Notes (Signed)
 PROGRESS NOTE        Jenna Wilson  ZOX:[id number] DOB: 01-06-1991 DOA: 10/14/2023  PCP: Patient, No Pcp Per     Brief Narrative:  33 year old with past medical history significant for substance abuse, depression, anxiety, on chronic Klonopin presented to the ED with suspected intentional overdose.  Patient was not able to provide history due to hypersomnolence.  Patient was brought to the ED via EMS from parking lot of a [employer].  Per EMS report patient sent text to a friend's estate she was overdosing because she was suicidal.  She initially says she took Klonopin and Seroquel  but unclear what she took.      Assessment & Plan:     Principal Problem:    Intentional benzodiazepine overdose, initial encounter (HCC)  Active Problems:    Hypokalemia    Substance use disorder    Depression with anxiety      1-Suspected intentional overdose of clonazepam with self-harm intent  Acute Metabolic Encephalopathy  -Continue to monitor the stepdown  -She was under IVC by ED physician  -Psych consulted  -she continue to be somnolent.  Check CBG.     Hypotension: Suspect related to overdose clonazepam  She has received multiple IV boluses  Continue with IV fluids  Will give 1 L NS, if no improvement will consult CCM.     Hypokalemia: Replete IV 3 runs ordered.     Depression/anxiety: Holding clonazepam.  Psych consulted    Substance use disorder: UDS positive for cocaine in addition to benzodiazepine              Estimated body mass index is 36.41 kg/m as calculated from the following:    Height as of this encounter: 5\' 2"  (1.575 m).    Weight as of this encounter: 90.3 kg.      DVT prophylaxis: Lovenox  Code Status: Full code  Family Communication:  Disposition Plan:   Status is: Observation  The patient will require care spanning > 2 midnights and should be moved to inpatient because: overdose        Consultants:   Psych     Procedures:   None    Antimicrobials:       Subjective:  She is somnolent. Moves extremities passively.        Objective:  Vitals:    10/15/23 0347 10/15/23 0400 10/15/23 0426 10/15/23 0500   BP:  (!) 87/41 (!) 89/57 (!) 86/61   Pulse:  (!) 107 (!) 103 (!) 101   Resp:  19 (!) 24 (!) 27   Temp: 98.7 F (37.1 C)      TempSrc: Axillary      SpO2:  96% 94% 93%   Weight:       Height:           Intake/Output Summary (Last 24 hours) at 10/15/2023 0642  Last data filed at 10/15/2023 0517  Gross per 24 hour   Intake 2477.45 ml   Output --   Net 2477.45 ml     Filed Weights    10/14/23 1438 10/15/23 0100   Weight: 81.6 kg 90.3 kg       Examination:    General exam: Appears calm and comfortable   Respiratory system: Respiratory effort normal.  Cardiovascular system: S1 & S2 heard, RRR.   Gastrointestinal system: Abdomen is nondistended, soft and nontender. No organomegaly or masses felt. Normal bowel sounds heard.  Central nervous system:  somnolent.   Extremities: no ede  Data Reviewed: I have personally reviewed following labs and imaging studies    CBC:  Recent Labs   Lab 10/14/23  1440 10/15/23  0259   WBC 8.4 12.8*   NEUTROABS 5.0  --    HGB 15.3* 13.6   HCT 44.3 40.3   MCV 90.0 92.2   PLT 341 315     Basic Metabolic Panel:  Recent Labs   Lab 10/14/23  1440 10/15/23  0259   NA 135 136   K 2.8* 3.3*   CL 102 106   CO2 21* 23   GLUCOSE 95 95   BUN 13 11   CREATININE 0.81 0.70   CALCIUM 8.8* 8.3*   MG 2.5*  --      GFR:  Estimated Creatinine Clearance: 103.6 mL/min (by C-G formula based on SCr of 0.7 mg/dL).  Liver Function Tests:  Recent Labs   Lab 10/14/23  1440 10/15/23  0259   AST 29 22   ALT 26 21   ALKPHOS 80 70   BILITOT 1.5* 1.2   PROT 7.1 6.3*   ALBUMIN 3.9 3.4*     No results for input(s): "LIPASE", "AMYLASE" in the last 168 hours.  No results for input(s): "AMMONIA" in the last 168 hours.  Coagulation Profile:  No results for input(s): "INR", "PROTIME" in the last 168 hours.  Cardiac Enzymes:  No results for input(s): "CKTOTAL", "CKMB", "CKMBINDEX", "TROPONINI" in the last 168 hours.  BNP (last 3 results)  No  results for input(s): "PROBNP" in the last 8760 hours.  HbA1C:  No results for input(s): "HGBA1C" in the last 72 hours.  CBG:  Recent Labs   Lab 10/14/23  1447   GLUCAP 91     Lipid Profile:  No results for input(s): "CHOL", "HDL", "LDLCALC", "TRIG", "CHOLHDL", "LDLDIRECT" in the last 72 hours.  Thyroid  Function Tests:  No results for input(s): "TSH", "T4TOTAL", "FREET4", "T3FREE", "THYROIDAB" in the last 72 hours.  Anemia Panel:  No results for input(s): "VITAMINB12", "FOLATE", "FERRITIN", "TIBC", "IRON", "RETICCTPCT" in the last 72 hours.  Sepsis Labs:  No results for input(s): "PROCALCITON", "LATICACIDVEN" in the last 168 hours.    Recent Results (from the past 240 hours)   MRSA Next Gen by PCR, Nasal     Status: None    Collection Time: 10/15/23 12:50 AM    Specimen: Nasal Mucosa; Nasal Swab   Result Value Ref Range Status    MRSA by PCR Next Gen NOT DETECTED NOT DETECTED Final     Comment: (NOTE)  The GeneXpert MRSA Assay (FDA approved for NASAL specimens only),  is one component of a comprehensive MRSA colonization surveillance  program. It is not intended to diagnose MRSA infection nor to guide  or monitor treatment for MRSA infections.  Test performance is not FDA approved in patients less than 2 years  old.  Performed at [facility], [address],  [place], [place] [address]                  Radiology Studies:  No results found.            Scheduled Meds:  . Chlorhexidine Gluconate Cloth  6 each Topical Daily   . enoxaparin (LOVENOX) injection  40 mg Subcutaneous Q24H   . sodium chloride  flush  3 mL Intravenous Q12H     Continuous Infusions:     LOS: 0 days       Time spent: 35 minutes  Danette Duos, MD  [facility]      If 7PM-7AM, please contact night-coverage  [email or web address]    10/15/2023, 6:42 AM

## 2023-10-16 ENCOUNTER — Inpatient Hospital Stay (HOSPITAL_COMMUNITY)
Admission: AD | Admit: 2023-10-16 | Discharge: 2023-10-23 | DRG: 885 | Disposition: A | Discharge status: Home / Self Care | Payer: MEDICAID | Source: Intra-hospital | Attending: Psychiatry | Admitting: Psychiatry

## 2023-10-16 ENCOUNTER — Other Ambulatory Visit: Payer: Self-pay

## 2023-10-16 ENCOUNTER — Encounter (HOSPITAL_COMMUNITY): Payer: Self-pay | Admitting: Family

## 2023-10-16 DIAGNOSIS — F1414 Cocaine abuse with cocaine-induced mood disorder: Secondary | ICD-10-CM | POA: Diagnosis present

## 2023-10-16 DIAGNOSIS — Z79899 Other long term (current) drug therapy: Secondary | ICD-10-CM

## 2023-10-16 DIAGNOSIS — F4312 Post-traumatic stress disorder, chronic: Secondary | ICD-10-CM | POA: Diagnosis present

## 2023-10-16 DIAGNOSIS — F314 Bipolar disorder, current episode depressed, severe, without psychotic features: Principal | ICD-10-CM | POA: Diagnosis present

## 2023-10-16 DIAGNOSIS — F411 Generalized anxiety disorder: Secondary | ICD-10-CM | POA: Diagnosis present

## 2023-10-16 DIAGNOSIS — T50902A Poisoning by unspecified drugs, medicaments and biological substances, intentional self-harm, initial encounter: Secondary | ICD-10-CM | POA: Diagnosis present

## 2023-10-16 DIAGNOSIS — E876 Hypokalemia: Secondary | ICD-10-CM | POA: Diagnosis present

## 2023-10-16 DIAGNOSIS — R45851 Suicidal ideations: Secondary | ICD-10-CM | POA: Diagnosis present

## 2023-10-16 DIAGNOSIS — F909 Attention-deficit hyperactivity disorder, unspecified type: Secondary | ICD-10-CM | POA: Diagnosis present

## 2023-10-16 DIAGNOSIS — F17210 Nicotine dependence, cigarettes, uncomplicated: Secondary | ICD-10-CM | POA: Diagnosis present

## 2023-10-16 DIAGNOSIS — F410 Panic disorder [episodic paroxysmal anxiety] without agoraphobia: Secondary | ICD-10-CM | POA: Diagnosis present

## 2023-10-16 DIAGNOSIS — K5900 Constipation, unspecified: Secondary | ICD-10-CM | POA: Diagnosis present

## 2023-10-16 DIAGNOSIS — G9341 Metabolic encephalopathy: Secondary | ICD-10-CM | POA: Diagnosis not present

## 2023-10-16 DIAGNOSIS — F319 Bipolar disorder, unspecified: Secondary | ICD-10-CM | POA: Diagnosis not present

## 2023-10-16 DIAGNOSIS — Z9151 Personal history of suicidal behavior: Secondary | ICD-10-CM | POA: Diagnosis not present

## 2023-10-16 DIAGNOSIS — T424X2A Poisoning by benzodiazepines, intentional self-harm, initial encounter: Secondary | ICD-10-CM | POA: Diagnosis not present

## 2023-10-16 DIAGNOSIS — F603 Borderline personality disorder: Secondary | ICD-10-CM | POA: Diagnosis present

## 2023-10-16 DIAGNOSIS — G4700 Insomnia, unspecified: Secondary | ICD-10-CM | POA: Diagnosis present

## 2023-10-16 DIAGNOSIS — Z5902 Unsheltered homelessness: Secondary | ICD-10-CM | POA: Diagnosis not present

## 2023-10-16 DIAGNOSIS — Z66 Do not resuscitate: Secondary | ICD-10-CM | POA: Diagnosis not present

## 2023-10-16 HISTORY — DX: Suicide attempt, initial encounter: T14.91XA

## 2023-10-16 HISTORY — DX: Insomnia, unspecified: G47.00

## 2023-10-16 HISTORY — DX: Personal history of other mental and behavioral disorders: Z86.59

## 2023-10-16 HISTORY — DX: Borderline personality disorder: F60.3

## 2023-10-16 HISTORY — DX: Bipolar disorder, unspecified: F31.9

## 2023-10-16 HISTORY — DX: Post-traumatic stress disorder, chronic: F43.12

## 2023-10-16 LAB — BASIC METABOLIC PANEL
Anion gap: 7 (ref 5–15)
BUN: 5 mg/dL — ABNORMAL LOW (ref 6–20)
CO2: 23 mmol/L (ref 22–32)
Calcium: 8.1 mg/dL — ABNORMAL LOW (ref 8.9–10.3)
Chloride: 112 mmol/L — ABNORMAL HIGH (ref 98–111)
Creatinine, Ser: 0.84 mg/dL (ref 0.44–1.00)
GFR, Estimated: >60 mL/min (ref >60)
Glucose, Bld: 84 mg/dL (ref 70–99)
Potassium: 3.2 mmol/L — ABNORMAL LOW (ref 3.5–5.1)
Sodium: 142 mmol/L (ref 135–145)

## 2023-10-16 LAB — GLUCOSE, CAPILLARY
Glucose-Capillary: 101 mg/dL — ABNORMAL HIGH (ref 70–99)
Glucose-Capillary: 101 mg/dL — ABNORMAL HIGH (ref 70–99)
Glucose-Capillary: 94 mg/dL (ref 70–99)
Glucose-Capillary: 97 mg/dL (ref 70–99)

## 2023-10-16 LAB — HEMOGLOBIN A1C
Hgb A1c MFr Bld: 5.1 % (ref 4.8–5.6)
Mean Plasma Glucose: 100 mg/dL

## 2023-10-16 MED ORDER — VITAMIN B-1 100 MG PO TABS: 100.0000 mg | ORAL_TABLET | Freq: Every day | ORAL | Status: DC

## 2023-10-16 MED ORDER — ADULT MULTIVITAMIN W/MINERALS CH: 1.0000 | ORAL_TABLET | Freq: Every day | ORAL | Status: AC

## 2023-10-16 MED ORDER — CLONAZEPAM 0.5 MG PO TABS
1.0000 mg | ORAL_TABLET | Freq: Three times a day (TID) | ORAL | Status: DC | PRN
  Administered 2023-10-16 – 2023-10-17 (×2): 1 mg via ORAL
  Filled 2023-10-16 (×2): qty 2

## 2023-10-16 MED ORDER — ULIPRISTAL ACETATE 30 MG PO TABS
30.0000 mg | ORAL_TABLET | Freq: Once | ORAL | Status: AC
  Administered 2023-10-16: 30 mg via ORAL
  Filled 2023-10-16: qty 1

## 2023-10-16 MED ORDER — NICOTINE 21 MG/24HR TD PT24: 21.0000 mg | MEDICATED_PATCH | Freq: Every day | TRANSDERMAL | Status: DC

## 2023-10-16 MED ORDER — QUETIAPINE FUMARATE 50 MG PO TABS: 50.0000 mg | ORAL_TABLET | Freq: Every day | ORAL | Status: DC

## 2023-10-16 MED ORDER — NICOTINE 21 MG/24HR TD PT24
21.0000 mg | MEDICATED_PATCH | Freq: Every day | TRANSDERMAL | Status: DC
  Administered 2023-10-17 – 2023-10-23 (×7): 21 mg via TRANSDERMAL
  Filled 2023-10-16 (×11): qty 1

## 2023-10-16 MED ORDER — PROPRANOLOL HCL 10 MG PO TABS
10.0000 mg | ORAL_TABLET | Freq: Two times a day (BID) | ORAL | Status: DC
  Administered 2023-10-16: 10 mg via ORAL
  Filled 2023-10-16: qty 1

## 2023-10-16 MED ORDER — LORAZEPAM 1 MG PO TABS: 1.0000 mg | ORAL_TABLET | Freq: Four times a day (QID) | ORAL | Status: DC | PRN

## 2023-10-16 MED ORDER — QUETIAPINE FUMARATE 200 MG PO TABS
200.0000 mg | ORAL_TABLET | Freq: Every day | ORAL | Status: DC
  Administered 2023-10-16: 200 mg via ORAL
  Filled 2023-10-16 (×3): qty 1

## 2023-10-16 MED ORDER — HYDROXYZINE HCL 25 MG PO TABS
25.0000 mg | ORAL_TABLET | Freq: Four times a day (QID) | ORAL | Status: AC | PRN
  Administered 2023-10-16 – 2023-10-18 (×5): 25 mg via ORAL
  Filled 2023-10-16 (×5): qty 1

## 2023-10-16 MED ORDER — HYDROXYZINE HCL 25 MG PO TABS: 25.0000 mg | ORAL_TABLET | Freq: Four times a day (QID) | ORAL | Status: DC | PRN

## 2023-10-16 MED ORDER — VITAMIN B-1 100 MG PO TABS
100.0000 mg | ORAL_TABLET | Freq: Every day | ORAL | Status: DC
  Administered 2023-10-17 – 2023-10-23 (×7): 100 mg via ORAL
  Filled 2023-10-16 (×10): qty 1

## 2023-10-16 MED ORDER — NICOTINE 21 MG/24HR TD PT24
21.0000 mg | MEDICATED_PATCH | Freq: Every day | TRANSDERMAL | Status: DC
  Administered 2023-10-16: 21 mg via TRANSDERMAL
  Filled 2023-10-16: qty 1

## 2023-10-16 MED ORDER — ALUM & MAG HYDROXIDE-SIMETH 200-200-20 MG/5ML PO SUSP: 30.0000 mL | ORAL | Status: DC | PRN

## 2023-10-16 MED ORDER — OLANZAPINE 5 MG PO TBDP
5.0000 mg | ORAL_TABLET | Freq: Three times a day (TID) | ORAL | Status: DC | PRN
  Administered 2023-10-16 – 2023-10-20 (×7): 5 mg via ORAL
  Filled 2023-10-16 (×7): qty 1

## 2023-10-16 MED ORDER — ACETAMINOPHEN 325 MG PO TABS: 650.0000 mg | ORAL_TABLET | Freq: Four times a day (QID) | ORAL | Status: DC | PRN

## 2023-10-16 MED ORDER — MAGNESIUM HYDROXIDE 400 MG/5ML PO SUSP
30.0000 mL | Freq: Every day | ORAL | Status: DC | PRN
  Administered 2023-10-18 – 2023-10-22 (×3): 30 mL via ORAL
  Filled 2023-10-16 (×3): qty 30

## 2023-10-16 MED ORDER — ONDANSETRON 4 MG PO TBDP: 4.0000 mg | ORAL_TABLET | Freq: Four times a day (QID) | ORAL | Status: AC | PRN

## 2023-10-16 MED ORDER — PROPRANOLOL HCL 10 MG PO TABS
10.0000 mg | ORAL_TABLET | Freq: Two times a day (BID) | ORAL | Status: DC
  Administered 2023-10-16 – 2023-10-23 (×14): 10 mg via ORAL
  Filled 2023-10-16 (×21): qty 1

## 2023-10-16 MED ORDER — PROCHLORPERAZINE EDISYLATE 10 MG/2ML IJ SOLN: 10.0000 mg | Freq: Four times a day (QID) | INTRAMUSCULAR | Status: DC | PRN

## 2023-10-16 MED ORDER — ADULT MULTIVITAMIN W/MINERALS CH
1.0000 | ORAL_TABLET | Freq: Every day | ORAL | Status: DC
  Administered 2023-10-17 – 2023-10-23 (×7): 1 via ORAL
  Filled 2023-10-16 (×10): qty 1

## 2023-10-16 MED ORDER — LOPERAMIDE HCL 2 MG PO CAPS
2.0000 mg | ORAL_CAPSULE | ORAL | Status: AC | PRN
Start: 2023-10-16 — End: 2023-10-18

## 2023-10-16 MED ORDER — ACETAMINOPHEN 325 MG PO TABS
650.0000 mg | ORAL_TABLET | Freq: Four times a day (QID) | ORAL | Status: DC | PRN
  Administered 2023-10-16 – 2023-10-21 (×2): 650 mg via ORAL
  Filled 2023-10-16 (×3): qty 2

## 2023-10-16 NOTE — Progress Notes (Signed)
 Called to assist unit to help with a patient who was upset over not knowing where her car was. Patient was [place] EMS [place]. Patient insisted her car was here and she was not at a [facility]. Security here looked for her car and it was not found. This Clinical research associate then called the motel [facility] and they stated that her car was there. Patient then concerned that someone may have the keys and will do damage to it. She granted this Clinical research associate permission to go through her personal belongings to look for them. Rene Kocher the Diplomatic Services operational officer was present during search and keys found. Patient notified that keys will remain with her personal possessions until she is ready for discharge. Patient given information on location of car.

## 2023-10-16 NOTE — Plan of Care (Signed)
  Problem: Clinical Measurements: Goal: Ability to maintain clinical measurements within normal limits will improve Outcome: Progressing Goal: Will remain free from infection Outcome: Progressing Goal: Diagnostic test results will improve Outcome: Progressing Goal: Respiratory complications will improve Outcome: Progressing Goal: Cardiovascular complication will be avoided Outcome: Progressing   Problem: Activity: Goal: Risk for activity intolerance will decrease Outcome: Progressing   Problem: Education: Goal: Knowledge of General Education information will improve Description: Including pain rating scale, medication(s)/side effects and non-pharmacologic comfort measures Outcome: Not Progressing   Problem: Health Behavior/Discharge Planning: Goal: Ability to manage health-related needs will improve Outcome: Not Progressing   Problem: Nutrition: Goal: Adequate nutrition will be maintained Outcome: Not Progressing   Problem: Coping: Goal: Level of anxiety will decrease Outcome: Not Progressing

## 2023-10-16 NOTE — Tx Team (Signed)
 Initial Treatment Plan 10/16/2023 5:18 PM Jenna Wilson FMW:[id number]    PATIENT STRESSORS: Financial difficulties   Marital or family conflict   Substance abuse     PATIENT STRENGTHS: Capable of independent living  General fund of knowledge    PATIENT IDENTIFIED PROBLEMS: Substance abuse                     DISCHARGE CRITERIA:  Ability to meet basic life and health needs  PRELIMINARY DISCHARGE PLAN: Attend aftercare/continuing care group Placement in alternative living arrangements  PATIENT/FAMILY INVOLVEMENT: This treatment plan has been presented to and reviewed with the patient, Jenna Wilson, .  The patient has been given the opportunity to ask questions and make suggestions.  Cinthya Bors C Kolbi Altadonna, RN 10/16/2023, 5:18 PM

## 2023-10-16 NOTE — TOC Transition Note (Signed)
 Transition of Care [facility]) - Discharge Note \ Patient Details  Name: Jenna Wilson MRN: [id number] Date of Birth: 07-24-1991  Transition of Care [facility]) CM/SW Contact:  Duwaine GORMAN Aran, LCSW Phone Number: 10/16/2023, 1:22 PM  Clinical Narrative: Patient is being admitted to [facility] and will remain under IVC. She will go to room 303-2 and the number for report is [phone]. CSW called [employer] to set up transportation for after 3PM. Treatment team updated. TOC signing off.  Final next level of care: IP Rehab Facility Barriers to Discharge: Barriers Resolved  Patient Goals and CMS Choice Choice offered to / list presented to : NA  Discharge Plan and Services Additional resources added to the After Visit Summary for   In-house Referral: Clinical Social Work Post Acute Care Choice: NA          DME Arranged: N/A DME Agency: NA  Social Drivers of Health (SDOH) Interventions SDOH Screenings   Food Insecurity: Patient Unable To Answer (10/15/2023)  Housing: Patient Unable To Answer (10/15/2023)  Transportation Needs: Patient Unable To Answer (10/15/2023)  Utilities: Patient Unable To Answer (10/15/2023)  Tobacco Use: High Risk (10/14/2023)   Readmission Risk Interventions     No data to display

## 2023-10-16 NOTE — Progress Notes (Signed)
 Pt is a 33 y/o female presenting IVC from [facility] following an intentional overdose on seroquel  and clonazepam . Pt texted a friend about the overdose and EMS ultimately obtained pt from a [address] [employer] parking lot.   Pt agitated, angry during assessment. Pt had much difficulty maintaining focus, and was ultimately unable to answer most assessment questions. Pt fixated on her car and medications. Pt hyperverbal. Pt was able to verbally contract for safety and did deny current SI/HI/AVH.  Pt states that she does not have much of a support system. Pt reports her family members have all passed in traumatic ways. Pt became increasingly agitated when asked about illicit drug use.   Pt remains in room this evening and states that she will not be participating in treatment.

## 2023-10-16 NOTE — Consult Note (Signed)
 [facility] Psychiatric Consult Initial    Patient Name: .Jenna Wilson   MRN: [id number]   DOB: 1991-07-25   Consult Order details:   Orders (From admission, onward)       Start     Ordered    10/14/23 2210  IP CONSULT TO PSYCHIATRY        Ordering Provider: Kenny Peals, MD   Provider:  (Not yet assigned)   Question Answer Comment   Location [facility]    Reason for Consult? Suspected intentional overdose of clonazepam and possibly Seroquel  with self-harm intent        10/14/23 2209                     Mode of Visit: In person       Psychiatry Consult Evaluation   Service Date: October 16, 2023  LOS:  LOS: 1 day   Chief Complaint     Primary Psychiatric Diagnoses   Bipolar 1 depressive episode  2.  Cocaine Use disorder  3.      Assessment   Jenna Wilson is a 33 y.o. female admitted: Medicallyfor 10/14/2023  2:32 PM for intentional overdose on benzodiazapine, Seroquel , Propranolol . She carries the psychiatric diagnoses of Bipolar 1, Polysubstance use disorder, cocaine use disorder, Post partum depression, insomnia, Generalized anxiety and has a past medical history of  Insomnia, Hepatitis C.     Her current presentation of suicide attempt in the setting of an hopelessness, financial insecurities and stressed out is most consistent with Bipolar disorder, depressive episode. She meets criteria for Bipolar based on  previously diagnosed (without substances) impulsivity, irritability, mania, pressured speech.  Current outpatient psychotropic medications include Clonazapam 1mg  po TID, Propranolol  10mg  po BID, Quetapine 50mg  po at bedtime and historically she has had a moderate response to these medications. She was fairly compliant with medications prior to admission as evidenced by patients report, pharmacy fill records, and dispense history. On initial examination, patient was observed to be sitting upright in bed, irritated yet grudgingly cooperative. She was tearful throughout the evaluation  especially when discussing her loss of her children, financial  insecurities, and family relationship strained. She did not wish to participate initially however consented and was able to engage well with this provider. Patient is very forthcoming and matter fact with most statements. She admits to attempting suicide by overdose on prescription medications. She reports relapsing in June 2024 on cocaine, at which time she quit in job at [employer]. SHe reports she has struggled with addiction for years now and it impacted the care and work she has done. She repots going on a search for fentanyl  in an attempt to overdose " I been out of the game too long. I couldn't find any." She states since she was unable to find any fentanyl  she went on a 2 day crack binge with this guy " her dealer I just met him. He filthies up my car and he is not my friend. He did crack with me and only wants my money."  She states she just got overwhelmed and decided to take all of her pills, and her Klonopin that she gets from outpatient psychiatrist. Jenna Wilson reports a long struggle with mental illness and addiction. She reports treatment failure of multiple medications. Despite her history she only reports 1 psychiatric hospitalization(inconsistent), SHe reports her triggers leading up to her suicide attempt are loss of her children(would not answer additional questions custodial or death), loss  of loved one, strained relationship with her parents, and finances.  She continues to endorse suicidal ideations at this time, in fact requested to become a DNR to prevent resuscitation in the event she attempts again. SHe reports multiple failed attempts at suicide by overdose, and angered her attempt did not work. She is ready to "get out of my earth suit."  Please see plan below for detailed recommendations.         Diagnoses:   Active Hospital problems:  Principal Problem:    Intentional benzodiazepine overdose, initial encounter [facility])  Active  Problems:    Hypokalemia    Substance use disorder    Depression with anxiety    Encephalopathy acute    Hypotension due to drugs    Intentional overdose (HCC)       Plan     ## Psychiatric Medication Recommendations:   -Ativan  detox protocol  -CIWA  -Resume home medications once medically stable.   -agitation protocol    ## Medical Decision Making Capacity: Not specifically addressed in this encounter    ## Further Work-up:     TSH, B12, folate, While pt on Qtc prolonging medications, please monitor & replete K+ to 4 and Mg2+ to 2, TOC consult for substance abuse resources, U/A, or UDS    -- Pertinent labwork reviewed earlier this admission includes: K+ Potassium, UDS cocaine and cannabis      ## Disposition:-- We recommend inpatient psychiatric hospitalization after medical hospitalization. Patient has been involuntarily committed on 10/14/2023.     ## Behavioral / Environmental: - No specific recommendations at this time.        ## Safety and Observation Level:   - Based on my clinical evaluation, I estimate the patient to be at high risk of self harm in the current setting.  - At this time, we recommend  1:1 Observation. This decision is based on my review of the chart including patient's history and current presentation, interview of the patient, mental status examination, and consideration of suicide risk including evaluating suicidal ideation, plan, intent, suicidal or self-harm behaviors, risk factors, and protective factors. This judgment is based on our ability to directly address suicide risk, implement suicide prevention strategies, and develop a safety plan while the patient is in the clinical setting. Please contact our team if there is a concern that risk level has changed.    CSSR Risk Category:C-SSRS RISK CATEGORY: High Risk    Suicide Risk Assessment:  Patient has following modifiable risk factors for suicide: under treated depression , social isolation, recklessness, and medication noncompliance,  which we are addressing by inpatient recommendation, medication management, therapy and susbtance abuse tretment.  Patient has following non-modifiable or demographic risk factors for suicide: separation or divorce, history of suicide attempt, history of self harm behavior, and psychiatric hospitalization  Patient has the following protective factors against suicide: Access to outpatient mental health care, Supportive family, Supportive friends, and Cultural, spiritual, or religious beliefs that discourage suicide    Thank you for this consult request. Recommendations have been communicated to the primary team.  We will continue to monitor at this time.     Rella Cardinal, FNP          History of Present Illness   Relevant Aspects of [facility] Course:   Admitted on 10/14/2023 for multidrug overdose. She was admitted to ICU for hypersomnolence and obtunded. She became hypotensive at times and required a critical care consult during this stay. She was able to  protect her airway.     Patient Report:   Jenna Wilson is a 33 year old female with a history of depression, anxiety, Bipolar 1 disorder and history of suicide attempt by cutting of wrist 20 years ago. Patient presents with significant affect lability, disorganized behaviors, suicidal statements and inconstancy in her story. At times when clarification is sought she avoids it or says that is not what I meant. She continues to endorse suicidal ideations and much dislike that her attempt was not successful. "Why do I keep making up? When asking patient how many pills she took she replied "not enough?"     Psych ROS:   Depression: hopeless, worthless, suicidal ideation, recurrent thoughts of death, insomnia  Anxiety:  nervousness, panic, excessive worry  Mania (lifetime and current): current impulsivity and irritability  Psychosis: (lifetime and current): Denies    Collateral information:   Denied assent, also requested to be a confidential patient.  Patient began litigious accusations when stating her ex-boyfriend called in her attempt citing HIPAA violation.     ROS     Psychiatric and Social History   Psychiatric History:   Information collected from patient and chart    Prev Dx/Sx: Bipolar 1, GAD  Current Psych Provider: Ruta Cousins, [facility] Meds (current): Propranolol , Klonopin, and Seroquel   Previous Med Trials: numerous too many to count per pateitn. Also had genetic testing   Therapy:     Prior Psych Hospitalization: Reports one, 2018 [facility]   Prior Self Harm: Yes  Prior Violence: No    Family Psych History: Denies  Family Hx suicide: UTA    Social History:   Developmental Hx:   Educational Hx: [employer]  Occupational Hx: Peer support, unemployed  Armed forces operational officer Hx: Denies   Living Situation: Homeless living in her car  Spiritual Hx:   Access to weapons/lethal means: Denies     Substance History  Alcohol: UTA   Type of alcohol UTA  Last Drink UTA  Number of drinks per day UTA  History of alcohol withdrawal seizures UTA  History of DT's UTA  Tobacco: UTA  Illicit drugs: Cocaine  Prescription drug abuse: Denies  Rehab hx: Denies    Exam Findings   Physical Exam: Caucasian female, large body habitus  Vital Signs:   Temp:  [98.3 F (36.8 C)-98.9 F (37.2 C)] 98.8 F (37.1 C) (01/03 0800)  Pulse Rate:  [51-107] 51 (01/03 0900)  Resp:  [16-30] 18 (01/03 0900)  BP: (85-138)/(49-98) 138/98 (01/03 0900)  SpO2:  [90 %-98 %] 92 % (01/03 0900)  Blood pressure (!) 138/98, pulse (!) 51, temperature 98.8 F (37.1 C), temperature source Axillary, resp. rate 18, height 5\' 2"  (1.575 m), weight 90.3 kg, SpO2 92%, unknown if currently breastfeeding. Body mass index is 36.41 kg/m.    Physical Exam    Mental Status Exam:  General Appearance: Casual   Orientation:  Full (Time, Place, and Person)   Memory:  Immediate;   Fair  Recent;   Fair   Concentration:  Concentration: Fair and Attention Span: Good   Recall:  Fair   Attention  Fair   Eye Contact:   Fair   Speech:  Clear and Coherent and Normal Rate   Language:  Good   Volume:  Normal   Mood: sucks   Affect:  Appropriate and Congruent   Thought Process:  Disorganized and Linear   Thought Content:  WDL   Suicidal Thoughts:  Yes.  with intent/plan  Homicidal Thoughts:  No   Judgement:  Poor   Insight:  Fair   Psychomotor Activity:  NA   Akathisia:  No   Fund of Knowledge:  Fair         Assets:  Communication Skills  Desire for Improvement  Leisure Time  Physical Health   Cognition:  WNL   ADL's:  Intact   AIMS (if indicated):           Other History    These have been pulled in through the EMR, reviewed, and updated if appropriate.   Family History:   The patient's family history is not on file.    Medical History:  Past Medical History:   Diagnosis Date   . Anxiety    . Depression    . Hx of drug abuse (HCC)     past heroin addict       Surgical History:  Past Surgical History:   Procedure Laterality Date   . NO PAST SURGERIES           Medications:     Current Facility-Administered Medications:   .  acetaminophen  (TYLENOL ) tablet 650 mg, 650 mg, Oral, Q6H PRN **OR** acetaminophen  (TYLENOL ) suppository 650 mg, 650 mg, Rectal, Q6H PRN, Patel, Vishal R, MD  .  Chlorhexidine Gluconate Cloth 2 % PADS 6 each, 6 each, Topical, Daily, Kenny Peals, MD, 6 each at 10/15/23 2030  .  enoxaparin (LOVENOX) injection 40 mg, 40 mg, Subcutaneous, Q24H, Patel, Vishal R, MD, 40 mg at 10/15/23 2055  .  hydrOXYzine  (ATARAX ) tablet 25 mg, 25 mg, Oral, Q6H PRN, Rella Cardinal, FNP, 25 mg at 10/15/23 2053  .  loperamide (IMODIUM) capsule 2-4 mg, 2-4 mg, Oral, PRN, Starkes-Perry, Takia S, FNP, 4 mg at 10/15/23 2053  .  LORazepam  (ATIVAN ) tablet 1 mg, 1 mg, Oral, Q6H PRN, Rella Cardinal, FNP, 1 mg at 10/16/23 0828  .  multivitamin with minerals tablet 1 tablet, 1 tablet, Oral, Daily, Starkes-Perry, Allana Ishikawa, FNP, 1 tablet at 10/16/23 1051  .  ondansetron  (ZOFRAN -ODT) disintegrating tablet 4 mg, 4 mg, Oral, Q6H PRN,  Olevia Bers, Allana Ishikawa, FNP  .  prochlorperazine (COMPAZINE) injection 10 mg, 10 mg, Intravenous, Q6H PRN, Lydia Sams, Vishal R, MD  .  sodium chloride  flush (NS) 0.9 % injection 3 mL, 3 mL, Intravenous, Q12H, Patel, Vishal R, MD, 3 mL at 10/16/23 1051  .  thiamine (VITAMIN B1) tablet 100 mg, 100 mg, Oral, Daily, Starkes-Perry, Takia S, FNP, 100 mg at 10/16/23 1051  .  ziprasidone (GEODON) injection 20 mg, 20 mg, Intramuscular, Q12H PRN, 20 mg at 10/15/23 2146 **AND** [COMPLETED] LORazepam  (ATIVAN ) tablet 1 mg, 1 mg, Oral, PRN, Starkes-Perry, Takia S, FNP, 1 mg at 10/15/23 2217    Allergies:  No Known Allergies    Rella Cardinal, FNP

## 2023-10-16 NOTE — Progress Notes (Signed)
 Pt unsteady on feet, reporting she has been in bed for 2 days, and reports she cannot lay down. Pt  slurred speech, confused  answers when responding to assessment questions Pt reporting whole body achy. Pt unable to focus and participate in guided meditation due to pt reported racing thoughts. Pt reports inability for visual focus at this time, but requesting a book.

## 2023-10-16 NOTE — Progress Notes (Signed)
 Patient belongings brought from ED this AM, 2 patient belonging bags placed in bin at charge station. Patient is agitated and asking us  to check to see if her belongings are all there. Patient asking about her phone, purse, and wallet. This Water Engineer checked patient belongings, phone, purse, clothing, shoes, were found in patient bag. Crack pipe found in patient purse, Disposed in a sharps container with [employer] from [employer]. No wallet found or glasses. Patient states that her wallet and glasses may be in her car. Charge nurse present during patient saying this. All belongings locked back in secure area with charge RN as witness.

## 2023-10-16 NOTE — TOC Initial Note (Signed)
 Transition of Care [facility]) - Initial/Assessment Note   Patient Details  Name: Jenna Wilson MRN: [id number] Date of Birth: 06-Jun-1991  Transition of Care [facility]) CM/SW Contact:    Duwaine GORMAN Aran, LCSW Phone Number: 10/16/2023, 9:54 AM  Clinical Narrative: Patient was admitted for an overdose and is currently under IVC. TOC following for discharge needs.              Expected Discharge Plan:  (TBD) Barriers to Discharge: Continued Medical Work up  Patient Goals and CMS Choice Choice offered to / list presented to : NA  Expected Discharge Plan and Services In-house Referral: Clinical Social Work Post Acute Care Choice: NA Living arrangements for the past 2 months: Mobile Home          DME Arranged: N/A DME Agency: NA  Prior Living Arrangements/Services Living arrangements for the past 2 months: Mobile Home Patient language and need for interpreter reviewed:: Yes Need for Family Participation in Patient Care: No (Comment) Care giver support system in place?: Yes (comment) Criminal Activity/Legal Involvement Pertinent to Current Situation/Hospitalization: No - Comment as needed  Emotional Assessment Orientation: : Oriented to Self Alcohol / Substance Use: Other (comment) (Benzodiazepine use) Psych Involvement: Yes (comment)  Admission diagnosis:  Hypokalemia [E87.6] Suicide attempt (HCC) [T14.91XA] Intentional benzodiazepine overdose, initial encounter (HCC) [T42.4X2A] Intentional overdose, initial encounter (HCC) [T50.902A] Patient Active Problem List   Diagnosis Date Noted   Encephalopathy acute 10/15/2023   Hypotension due to drugs 10/15/2023   Intentional overdose (HCC) 10/15/2023   Intentional benzodiazepine overdose, initial encounter (HCC) 10/14/2023   Hypokalemia 10/14/2023   Substance use disorder 10/14/2023   Depression with anxiety 10/14/2023   Cocaine abuse with cocaine-induced mood disorder (HCC) 03/09/2017   Chronic post-traumatic stress disorder (PTSD) 11/27/2016    Insomnia due to anxiety and fear 11/27/2016   GAD (generalized anxiety disorder) 05/06/2016   Post partum depression 05/05/2014   NVD (normal vaginal delivery) 03/22/2014   Other specified indication for care or intervention related to labor and delivery, unspecified as to episode of care 03/21/2014   PCP:  Patient, No Pcp Per Pharmacy:   [id number] [place], [place] - [address] AT [address] [facility] [address] & [facility] [address] [place] [address] Phone: [phone] Fax: [phone]  [facility] [address] [place] - [address] [place] [address] Phone: [phone] Fax: [phone]  [id number] [place], [place] - [address] AT [facility] OF [address] & [address] [place] [address] Phone: [phone] Fax: [phone]  Social Drivers of Health (SDOH) Social History: SDOH Screenings   Food Insecurity: Patient Unable To Answer (10/15/2023)  Housing: Patient Unable To Answer (10/15/2023)  Transportation Needs: Patient Unable To Answer (10/15/2023)  Utilities: Patient Unable To Answer (10/15/2023)  Tobacco Use: High Risk (10/14/2023)   SDOH Interventions:    Readmission Risk Interventions     No data to display

## 2023-10-16 NOTE — Plan of Care (Signed)
  Problem: Education: Goal: Emotional status will improve Outcome: Not Progressing Goal: Mental status will improve Outcome: Not Progressing   Problem: Coping: Goal: Ability to verbalize frustrations and anger appropriately will improve Outcome: Not Progressing

## 2023-10-16 NOTE — Discharge Summary (Signed)
 Jenna Wilson ZOX:[id number] DOB: 04-Jan-1991 DOA: 10/14/2023    PCP: Patient, No Pcp Per    Admit date: 10/14/2023  Discharge date: 10/16/2023    Admitted From: Car/Homeless   Disposition:  [facility]      Home Health: na    Equipment/Devices: na   Consultations: Psychiatry  Discharge Condition: Improved   CODE STATUS: Full code     Diet Recommendation: Heart Healthy     Diet Order                Diet Carb Modified Fluid consistency: Thin; Room service appropriate? Yes  Diet effective now             Diet general                             Chief Complaint   Patient presents with   . Suicidal        Brief history of present illness from the day of admission and additional interim summary        Jenna Wilson is a 33 y.o. female with medical history significant for substance use disorder, depression/anxiety on chronic Klonopin who presented to the ED via EMS for suspected intentional overdose.  Patient is unable to provide history due to hypersomnolence and is otherwise supplemented by EDP and chart review.     Patient was brought to the ED via EMS from parking lot of [employer].  Per EMS report, patient sent a text to a friend that stated she was overdosing because she was suicidal.  She initially said that she took Klonopin and Seroquel but per ED documentation has had inconsistent history regarding what she took.     Patient was placed under IVC by the initial ED provider due to concern for suicidal intent.     PDMP reviewed.  She is on long-term clonazepam, last prescription written and filled: Clonazepam 1 mg #90 tablets on 09/29/2023.                                                                     Hospital Course       Initial vitals showed BP 110/65, pulse 110, RR 22, temp 97.7 F, SpO2 92% on room air.     Labs showed sodium 135, potassium 2.8, bicarb  21, BUN 13, creatinine 0.81, serum glucose 95, AST 29, ALT 26, alk phos 80, total bilirubin 1.5, WBC 8.4, hemoglobin 15.3, platelets 341,000.     Serum ethanol, acetaminophen, and salicylate levels undetectable.  Lithium level <0.06.     UDS positive for cocaine and benzodiazepines.  Serum hCG negative.     Patient was given 1 L LR.  Patient was placed under IVC by ED provider.  Patient remained somnolent/obtunded after 6 hours of monitoring therefore medical admission was requested for airway monitoring and medical clearance.    Patient did well overnight with conservative management.  Her hypokalemia was repleted. This morning patient is awake and alert.  Her main concerns are getting something to eat or drink as well as getting to her car to make sure her phone is still in her purse.  Other than that she complains of continued and chronic body pain.  She also notes that her nighttime sitter was rude.  Patient would like to be started on pain management.  Also requests Plan B.    Patient was seen by psychiatry and started on CIWA and Ativan detox to monitor for benzodiazepine withdrawal.  She had been placed under IVC for suicide attempt.    Patient was given Plan B per her request.  Was placed on nicotine patch per her request.  She is medically clear to be transferred to [facility].  Single episode of hypertension was taken when she was in the middle of a panic attack about her purse in her car, blood pressure has come down on its own.  Have restarted her home doses of propranolol 10 mg p.o. twice daily.  Home doses of clonazepam are still being held and can be restarted per [facility].        Discharge diagnosis        Principal Problem:    Intentional benzodiazepine overdose, initial encounter [facility])  Active Problems:    Hypokalemia    Substance use disorder    Depression with anxiety    Encephalopathy acute    Hypotension due to drugs    Intentional overdose [facility])        Discharge instructions      Discharge Instructions        Diet general   Complete by: As directed      Increase activity slowly   Complete by: As directed              Discharge Medications     Allergies as of 10/16/2023    No Known Allergies          Medication List        STOP taking these medications      clonazePAM 1 MG tablet  Commonly known as: KLONOPIN            TAKE these medications      acetaminophen 325 MG tablet  Commonly known as: TYLENOL  Take 2 tablets (650 mg total) by mouth every 6 (six) hours as needed for mild pain (pain score 1-3) (or Fever >/= 101).     hydrOXYzine 25 MG tablet  Commonly known as: ATARAX  Take 1 tablet (25 mg total) by mouth every 6 (six) hours as needed (anxiety/agitation or CIWA < or = 10).     LORazepam 1 MG tablet  Commonly known as: ATIVAN  Take 1 tablet (1 mg total) by mouth every 6 (six) hours as needed (CIWA > 10).     multivitamin with minerals Tabs tablet  Take 1 tablet by mouth daily.  Start taking on: October 17, 2023     nicotine 21 mg/24hr patch  Commonly known as: NICODERM CQ - dosed in mg/24 hours  Place 1 patch (21 mg total) onto the skin daily.  Start taking on: October 17, 2023     propranolol 10 MG tablet  Commonly known as: INDERAL  Take 10 mg by mouth 2 (two) times daily.     QUEtiapine 50 MG tablet  Commonly known as: SEROQUEL  Take 50 mg by mouth at bedtime.     QUEtiapine 200 MG tablet  Commonly known as: SEROQUEL  Take 1 tablet (200 mg total) by mouth at bedtime.     thiamine 100 MG tablet  Commonly known as: Vitamin B-1  Take 1 tablet (100 mg total) by mouth daily.  Start taking on: October 17, 2023  Major procedures and Radiology Reports - PLEASE review detailed and final reports thoroughly  -           No results found.    Micro Results       Recent Results (from the past 240 hours)   MRSA Next Gen by PCR, Nasal     Status: None    Collection Time: 10/15/23 12:50 AM    Specimen: Nasal Mucosa; Nasal Swab   Result Value Ref Range Status    MRSA by PCR Next Gen NOT DETECTED NOT DETECTED Final      Comment: (NOTE)  The GeneXpert MRSA Assay (FDA approved for NASAL specimens only),  is one component of a comprehensive MRSA colonization surveillance  program. It is not intended to diagnose MRSA infection nor to guide  or monitor treatment for MRSA infections.  Test performance is not FDA approved in patients less than 2 years  old.  Performed at [facility], [address],  [place], [place] [address]         Today     Subjective       Jenna Wilson agrees to be transferred to [facility].  Objective     Blood pressure 119/75, pulse (!) 104, temperature (!) 97.5 F (36.4 C), temperature source Oral, resp. rate 20, height 5\' 2"  (1.575 m), weight 90.3 kg, SpO2 100%, unknown if currently breastfeeding.      Intake/Output Summary (Last 24 hours) at 10/16/2023 1403  Last data filed at 10/16/2023 1130  Gross per 24 hour   Intake 2250.87 ml   Output 3625 ml   Net -1374.13 ml       Exam  General: Patient is awake and alert, she is speaking loudly and coherently.  No evidence of any speech or communication abnormality.  Eyes: sclera anicteric, conjuctiva mild injection bilaterally  CVS: S1-S2, regular   Respiratory:  decreased air entry bilaterally secondary to decreased inspiratory effort, rales at bases   GI: NABS, soft, NT   LE: No edema.   Neuro: A/O x 3, Moving all extremities equally with normal strength, CN 3-12 intact, grossly nonfocal.   Psych: patient is logical and coherent, judgement and insight appear normal, mood and affect appropriate to situation.       Data Review     CBC w Diff:   Lab Results   Component Value Date    WBC 12.8 (H) 10/15/2023    HGB 13.6 10/15/2023    HCT 40.3 10/15/2023    PLT 315 10/15/2023    LYMPHOPCT 26 10/14/2023    MONOPCT 11 10/14/2023    EOSPCT 2 10/14/2023    BASOPCT 1 10/14/2023       CMP:   Lab Results   Component Value Date    NA 142 10/16/2023    NA 139 05/08/2017    K 3.2 (L) 10/16/2023    CL 112 (H) 10/16/2023    CO2 23 10/16/2023    BUN 5 (L) 10/16/2023     BUN 10 05/08/2017    CREATININE 0.84 10/16/2023    PROT 6.3 (L) 10/15/2023    PROT 6.8 05/08/2017    ALBUMIN 3.4 (L) 10/15/2023    ALBUMIN 4.2 05/08/2017    BILITOT 1.2 10/15/2023    BILITOT 0.3 05/08/2017    ALKPHOS 70 10/15/2023    AST 22 10/15/2023    ALT 21 10/15/2023   .      Total Time in preparing paper work, data evaluation and  todays exam - 35 minutes    Pieter Partridge M.D on 10/16/2023 at 2:03 PM    [facility]

## 2023-10-17 DIAGNOSIS — Z79899 Other long term (current) drug therapy: Secondary | ICD-10-CM

## 2023-10-17 MED ORDER — CLONAZEPAM 0.5 MG PO TABS
0.5000 mg | ORAL_TABLET | Freq: Every day | ORAL | Status: DC | PRN
  Administered 2023-10-17 – 2023-10-18 (×2): 0.5 mg via ORAL
  Filled 2023-10-17 (×2): qty 1

## 2023-10-17 MED ORDER — WHITE PETROLATUM EX OINT
TOPICAL_OINTMENT | CUTANEOUS | Status: AC
  Administered 2023-10-17: 1
  Filled 2023-10-17: qty 5

## 2023-10-17 MED ORDER — PRAZOSIN HCL 1 MG PO CAPS
1.0000 mg | ORAL_CAPSULE | Freq: Two times a day (BID) | ORAL | Status: DC
  Administered 2023-10-17 – 2023-10-23 (×13): 1 mg via ORAL
  Filled 2023-10-17 (×23): qty 1

## 2023-10-17 MED ORDER — POTASSIUM CHLORIDE CRYS ER 20 MEQ PO TBCR
40.0000 meq | EXTENDED_RELEASE_TABLET | Freq: Once | ORAL | Status: AC
  Administered 2023-10-17: 40 meq via ORAL
  Filled 2023-10-17 (×2): qty 2

## 2023-10-17 MED ORDER — OLANZAPINE 10 MG IM SOLR: 5.0000 mg | Freq: Three times a day (TID) | INTRAMUSCULAR | Status: DC | PRN

## 2023-10-17 MED ORDER — CLONAZEPAM 0.5 MG PO TABS
1.0000 mg | ORAL_TABLET | Freq: Two times a day (BID) | ORAL | Status: DC
  Administered 2023-10-17 – 2023-10-19 (×5): 1 mg via ORAL
  Filled 2023-10-17 (×6): qty 2

## 2023-10-17 MED ORDER — QUETIAPINE FUMARATE 300 MG PO TABS
300.0000 mg | ORAL_TABLET | Freq: Every day | ORAL | Status: DC
  Administered 2023-10-17: 300 mg via ORAL
  Filled 2023-10-17 (×2): qty 1

## 2023-10-17 NOTE — Plan of Care (Signed)
   Problem: Education: Goal: Emotional status will improve Outcome: Not Progressing Goal: Mental status will improve Outcome: Not Progressing   Problem: Activity: Goal: Interest or engagement in activities will improve Outcome: Not Progressing
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
## 2023-10-17 NOTE — Progress Notes (Addendum)
 D. Pt continues to present irritable, restless, with multiple complaints- complained of racing thoughts, and that she was exhausted, but was unable to lay down. Earlier in the shift, pt received po clonazepam  for anxiety, and po olanzapine  for agitation, but has observed little relief. Pt refuses to sit in the dayroom, go to groups, complains of other patients 'laughing too loud and others not liking her. Pt also complaining of moderate back pain, refused Tylenol , but agreed to heat packs for relief. Pt endorses passive SI, reporting that she no longer wants to live- but does contract for safety while in the hospital.    A. Labs and vitals monitored. Pt given and educated on medications. Pt supported emotionally and encouraged to express concerns and ask questions.   R. Pt remains safe with 15 minute checks. Will continue POC.    10/17/23 1200  Psych Admission Type (Psych Patients Only)  Admission Status Involuntary  Psychosocial Assessment  Patient Complaints Hopelessness;Irritability;Restlessness  Eye Contact Poor  Facial Expression Sad;Anxious  Affect Depressed;Anxious  Speech Logical/coherent  Interaction Assertive;Needy;Demanding  Motor Activity Restless;Unsteady  Appearance/Hygiene Disheveled  Behavior Characteristics Unwilling to participate;Agitated;Fidgety;Irritable;Restless  Mood Anxious;Depressed;Irritable  Thought Process  Coherency Tangential  Content Blaming others  Delusions None reported or observed  Perception WDL  Hallucination None reported or observed  Judgment Poor  Confusion None  Danger to Self  Current suicidal ideation? Passive  Self-Injurious Behavior Some self-injurious ideation observed or expressed.  No lethal plan expressed   Agreement Not to Harm Self Yes  Description of Agreement agreed to contact staff before acting on harmful thoughts  Danger to Others  Danger to Others None reported or observed

## 2023-10-17 NOTE — [facility] Group Notes (Signed)
 [facility] Group Notes:  (Nursing/MHT/Case Management/Adjunct)  Date:  10/17/2023  Time:  2000  Type of Therapy:   Wrap up group  Participation Level:  Active  Participation Quality:  Drowsy and Inattentive  Affect:  Blunted  Cognitive:  Alert  Insight:  Limited  Engagement in Group:  Developing/Improving  Modes of Intervention:  Clarification, Education, and Socialization  Summary of Progress/Problems: Positive thinking and self-care were discussed.   Lenora Manuelita RAMAN 10/17/2023, 9:26 PM

## 2023-10-17 NOTE — Progress Notes (Signed)
 D pt received asleep, once awake she asked for medication, pt then c/o anxiety. Pt disorganized with blunted affect, slurred speech, unsteady gait. Pt provided PRN medication for anxious behaviors and advised to return to bed. With pt restlessness unaffected and pt continuing to c/o agitation Pt later c/o pain throughout body and was given PRN tylenol  and rested breifly only to wake again an hour later with clearer sensorium, and speech soft but slurring decreased.
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
## 2023-10-17 NOTE — Group Note (Signed)
 Date:  10/17/2023 Time:  11:49 AM  Group Topic/Focus:  Goals Group:   The focus of this group is to help patients establish daily goals to achieve during treatment and discuss how the patient can incorporate goal setting into their daily lives to aide in recovery. Orientation:   The focus of this group is to educate the patient on the purpose and policies of crisis stabilization and provide a format to answer questions about their admission.  The group details unit policies and expectations of patients while admitted.    Participation Level:  Did Not Attend  Participation Quality:   n/a  Affect:   n/a  Cognitive:   n/a  Insight: None  Engagement in Group:   n/a  Modes of Intervention:   n/a  Additional Comments:   Pt did not attend the group.  Addison HERO Nikhita Mentzel 10/17/2023, 11:49 AM
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
## 2023-10-18 LAB — DRUG PROFILE, UR, 9 DRUGS (LABCORP)
Amphetamines, Urine: NEGATIVE ng/mL
Barbiturate, Ur: NEGATIVE ng/mL
Benzodiazepine Quant, Ur: NEGATIVE ng/mL
Cannabinoid Quant, Ur: NEGATIVE ng/mL
Cocaine (Metab.): POSITIVE ng/mL — AB
Methadone Screen, Urine: NEGATIVE ng/mL
Opiate Quant, Ur: NEGATIVE ng/mL
Phencyclidine, Ur: NEGATIVE ng/mL
Propoxyphene, Urine: NEGATIVE ng/mL

## 2023-10-18 MED ORDER — QUETIAPINE FUMARATE 400 MG PO TABS
400.0000 mg | ORAL_TABLET | Freq: Every day | ORAL | Status: DC
  Administered 2023-10-18 – 2023-10-22 (×5): 400 mg via ORAL
  Filled 2023-10-18 (×7): qty 1

## 2023-10-18 MED ORDER — WHITE PETROLATUM EX OINT
TOPICAL_OINTMENT | CUTANEOUS | Status: AC
  Filled 2023-10-18: qty 5

## 2023-10-18 MED ORDER — POLYETHYLENE GLYCOL 3350 17 G PO PACK
17.0000 g | PACK | Freq: Every day | ORAL | Status: DC
  Administered 2023-10-18 – 2023-10-20 (×3): 17 g via ORAL
  Filled 2023-10-18 (×9): qty 1

## 2023-10-18 MED ORDER — BUPROPION HCL ER (XL) 150 MG PO TB24
150.0000 mg | ORAL_TABLET | Freq: Every day | ORAL | Status: DC
  Administered 2023-10-18 – 2023-10-23 (×6): 150 mg via ORAL
  Filled 2023-10-18 (×10): qty 1

## 2023-10-18 NOTE — BHH Suicide Risk Assessment (Signed)
 BHH INPATIENT:  Family/Significant Other Suicide Prevention Education  Suicide Prevention Education:  Education Completed; friend [name] [phone] cell,  (name of family member/significant other) has been identified by the patient as the family member/significant other with whom the patient will be residing, and identified as the person(s) who will aid the patient in the event of a mental health crisis (suicidal ideations/suicide attempt).    Friend does not believe patient has access to firearms.  She does not know where the patient will go at discharge, took the number for [employer] because does not believe she will return to [facility].  With written consent from the patient, the family member/significant other has been provided the following suicide prevention education, prior to the and/or following the discharge of the patient.  The suicide prevention education provided includes the following: Suicide risk factors Suicide prevention and interventions National Suicide Hotline telephone number [facility] assessment telephone number [facility] Emergency Assistance 911 [facility] and/or Residential Mobile Crisis Unit telephone number  Request made of family/significant other to: Remove weapons (e.g., guns, rifles, knives), all items previously/currently identified as safety concern.   Remove drugs/medications (over-the-counter, prescriptions, illicit drugs), all items previously/currently identified as a safety concern.  The family member/significant other verbalizes understanding of the suicide prevention education information provided.  The family member/significant other agrees to remove the items of safety concern listed above.  Jenna Wilson 10/18/2023, 1:53 PM

## 2023-10-18 NOTE — Progress Notes (Signed)
   10/17/23 2300  Psych Admission Type (Psych Patients Only)  Admission Status Involuntary  Psychosocial Assessment  Patient Complaints Anxiety;Depression  Eye Contact Brief  Facial Expression Anxious  Affect Depressed;Anxious  Speech Pressured  Interaction Needy  Motor Activity Restless  Appearance/Hygiene Disheveled  Behavior Characteristics Appropriate to situation  Mood Depressed  Thought Process  Coherency Disorganized  Content Blaming others  Delusions None reported or observed  Perception WDL  Hallucination None reported or observed  Judgment Poor  Confusion None  Danger to Self  Current suicidal ideation? Denies  Self-Injurious Behavior No self-injurious ideation or behavior indicators observed or expressed   Agreement Not to Harm Self Yes  Description of Agreement verbal  Danger to Others  Danger to Others None reported or observed   Pt came to the nurse at midnight complaining of her medication waring out and asking for more medication. Pt explained that she had all the medications that she needed to take. Pt became loud and accusing nurse of treating her like trash, threatening to go off. Pt would not follow redirection, pt medicated with Zyprexa  5 mg PO. Will continue to monitor.

## 2023-10-18 NOTE — Plan of Care (Signed)
   Problem: Education: Goal: Emotional status will improve Outcome: Progressing Goal: Mental status will improve Outcome: Progressing

## 2023-10-18 NOTE — Progress Notes (Addendum)
 Patient presents tearful, agitated, ruminating on her inability to focus- pt agreeable to take po olanzapine for relief.

## 2023-10-18 NOTE — BHH Group Notes (Signed)
 BHH Group Notes:  (Nursing)  Date:  10/18/2023  Time: 1400  Type of Therapy:  Psychoeducational Skills  Participation Level:  None  Participation Quality:  Inattentive  Affect:  Depressed  Cognitive:  Lacking  Insight:  None  Engagement in Group:  None  Modes of Intervention:  Discussion, Exploration, Rapport Building, Socialization, and Support  Summary of Progress/Problems: Pt sat in group for the first few minutes, but did not want to participate. Pt sat and colored for the first 15 minutes then left.   Berwyn GORMAN Acosta 10/18/2023, 3:49 PM

## 2023-10-18 NOTE — Progress Notes (Signed)
 Patient presented much calmer this afternoon- and stated that she was unsure what to attribute it to:  the Wellbutrin  or the olanzapine -Pt reported that she was able to sit in the dayroom with others while working on a puzzle, and was able to read a little bit of her book in her room.

## 2023-10-18 NOTE — Progress Notes (Addendum)
 D. Pt presents anxious, irritable, reported poor sleep last night. Pt described her appetite as 'good', concentration as 'poor', energy level as 'hyper'. Per pt's self inventory, pt rated her depression,hopelessness and anxiety a 6/7/9, respectively. Pt's stated goal today is to figure out where I can live. I'm homeless and live in my car. There are no resources for me.  Pt currently denies SI/HI and AVH and agrees to contact staff before acting on any harmful thoughts.  A. Labs and vitals monitored. Pt given and educated on medications. Pt supported emotionally and encouraged to express concerns and ask questions.   R. Pt remains safe with 15 minute checks. Will continue POC.    10/18/23 1200  Psych Admission Type (Psych Patients Only)  Admission Status Involuntary  Psychosocial Assessment  Patient Complaints Restlessness;Hopelessness;Depression;Agitation;Anxiety  Eye Contact Brief  Facial Expression Anxious;Sad  Affect Depressed;Anxious  Speech Pressured  Interaction Demanding;Needy;Assertive  Motor Activity Restless  Appearance/Hygiene Disheveled  Behavior Characteristics Agitated;Anxious;Fidgety;Restless  Mood Depressed;Irritable;Preoccupied  Thought Process  Coherency Disorganized  Content Blaming others  Delusions None reported or observed  Perception WDL  Hallucination None reported or observed  Judgment Poor  Confusion None  Danger to Self  Current suicidal ideation? Passive  Self-Injurious Behavior No self-injurious ideation or behavior indicators observed or expressed   Agreement Not to Harm Self Yes  Description of Agreement agreed to contact staff before acting on harmful thoughts  Danger to Others  Danger to Others None reported or observed

## 2023-10-18 NOTE — Plan of Care (Signed)
  Problem: Activity: Goal: Interest or engagement in activities will improve Outcome: Progressing   Problem: Safety: Goal: Periods of time without injury will increase Outcome: Progressing   Problem: Education: Goal: Knowledge of [facility] Education information/materials will improve Outcome: Not Progressing   Problem: Coping: Goal: Ability to verbalize frustrations and anger appropriately will improve Outcome: Not Progressing

## 2023-10-18 NOTE — [facility] Group Notes (Signed)
 Adult Psychoeducational Group Note  Date:  10/18/2023 Time:  9:29 PM  Group Topic/Focus:  Wrap-Up Group:   The focus of this group is to help patients review their daily goal of treatment and discuss progress on daily workbooks.  Participation Level:  Active  Participation Quality:  Appropriate  Affect:  Appropriate  Cognitive:  Appropriate  Insight: Appropriate  Engagement in Group:  Engaged  Modes of Intervention:  Discussion and Support  Additional Comments:  Pt told that today was an okay day on the unit, the highlight of which was catching up on her rest. On the subject of goals for the week, Pt mentioned wanting not to feel numb. At least I'm not screaming like I was. Pt was encouraged to talk with her doctor and nurse about this. She rated her day a 3 out of 10.  Jenna Wilson 10/18/2023, 9:29 PM
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
## 2023-10-18 NOTE — Group Note (Signed)
 Date:  10/18/2023 Time:  6:46 PM  Group Topic/Focus:  Goals Group:   The focus of this group is to help patients establish daily goals to achieve during treatment and discuss how the patient can incorporate goal setting into their daily lives to aide in recovery. Orientation:   The focus of this group is to educate the patient on the purpose and policies of crisis stabilization and provide a format to answer questions about their admission.  The group details unit policies and expectations of patients while admitted.    Participation Level:  Did Not Attend   Jenna Wilson Mars 10/18/2023, 6:46 PM

## 2023-10-18 NOTE — Progress Notes (Addendum)
 [facility] MD Progress Note  10/18/2023 11:10 AM Jenna Wilson  MRN:  [id number] Subjective:   Jenna Wilson is a 33 yo patient with a PPH of Bipolar d/o, PTSD, GAD, Stimulant use d/o-cocaine, Postpartum depression, and ADHD who presented to [facility] via EMS after intentional OD on Klonopin  and Seroquel  ( EMR notes that patient had some inconsistently in reports on what she took). Patient transferred to [facility] after being declared medical stable.   RN reports that overnight patient did get irritable and was yelling at RN that she felt like she was not getting good care and could not be redirected. Patient required Zyprexa  5mg  and Hydroxyzine . During the day patient received a total of 1.5mg  Klonopin  PRN as well, although RN noted that it did not really appear to help patient much with her anxiety.   Patient seen twice this AM, because patient asked to speak to provider the second time as she felt like she was going to punch someone because she was so angry. On initial assessment patient endorsed that she did not sleep well last night, but also endorsed that she had slept a lot during the day and did not feel comfortable in the bed, so she wanted to spend more time awake today. Patient did endorse that she slept some after drinking caffeine. Patient endorsed that she was still feeling that it is difficult to focus and she is not able to read in her room. Patient initially denied SI, HI, and AVH. Patient endorsed that her appetite is low and she feels like she is constipated. Patient reports that she continues to feel anxious but her thoughts are not as rapid as they were before. Patient endorsed that she does feel like people are talking about her behind her back and like the other patient's are avoiding her.   On second interaction patient is very irritable and yelling. Patient briefly tearful and endorses that she attempted group but is was very triggering. Patient endorsed that she was angry at everyone in the group because  they were all talking about their families and children. Patient endorsed that she was made that they may have bad relationships with their family, but they at least had someone alive to dislike. Patient reports she wanted to hit someone because of how upset she was. Provider attempted to calm patient down and talk about how patient identifies herself. Patient endorsed identifying herself through her grief. Provider attempted to talk to patient about what is immediately in her control and possible changing her thought content and distractions, but patient continued to ruminate on how  I can't do anything, I can't focus, I can't do word search, I can't color! Patient continued to yell and when provider stepped away to talk to nursing , patient yelled at staff. Provider came back to patient and patient was still upset and endorsed there is nothing she can do. Patient endorsed that she wishes she had been able to kill herself and is mad that she has been committed and feels she should have the right to kill herself and is frustrated that society does not allow this.     Principal Problem: Bipolar I disorder with depression, severe (HCC) Diagnosis: Principal Problem:   Bipolar I disorder with depression, severe (HCC) Active Problems:   Chronic post-traumatic stress disorder (PTSD)   Cocaine abuse with cocaine-induced mood disorder (HCC)   Intentional benzodiazepine overdose, initial encounter (HCC)   Hypokalemia   Intentional overdose (HCC)   Chronic prescription benzodiazepine use  Total  Time spent with patient: 45 minutes  Past Psychiatric History: INPT: Multiple hospitalization including 2018 in [facility], endorses first hospital occurred around age [age] yo OPT: Current, NP Tinnie Garret with [facility] Therapy: Yes in the past including EMDR Previous meds: Prazosin , Klonopin , Seroquel , Propanolol, Lithium  (became hypersexual on this), reports all typical antidepressants worsen my SI endorses  she has been on remeron, Lexapro, Prozac, Zoloft , buspar  (made her lightheaded). Denies Effexor or Cymbalta or wellbutrin   Past Medical History:  Past Medical History:  Diagnosis Date   Anxiety    Depression    Hx of drug abuse (HCC)    past heroin addict    Past Surgical History:  Procedure Laterality Date   NO PAST SURGERIES     Family History:  Family History  Problem Relation Age of Onset   Alcohol abuse Neg Hx    Arthritis Neg Hx    Asthma Neg Hx    Birth defects Neg Hx    Cancer Neg Hx    COPD Neg Hx    Depression Neg Hx    Diabetes Neg Hx    Drug abuse Neg Hx    Early death Neg Hx    Hearing loss Neg Hx    Heart disease Neg Hx    Hyperlipidemia Neg Hx    Hypertension Neg Hx    Kidney disease Neg Hx    Learning disabilities Neg Hx    Mental illness Neg Hx    Mental retardation Neg Hx    Miscarriages / Stillbirths Neg Hx    Stroke Neg Hx    Vision loss Neg Hx    Varicose Veins Neg Hx    Family Psychiatric  History: Mom: Anxiety Younger brother: SUD> died from OD Social History:  Social History   Substance and Sexual Activity  Alcohol Use No   Comment: None in over a month     Social History   Substance and Sexual Activity  Drug Use Yes   Types: Crack cocaine   Comment: pt reports smoking crack this month    Social History   Socioeconomic History   Marital status: Married    Spouse name: Not on file   Number of children: Not on file   Years of education: Not on file   Highest education level: Not on file  Occupational History   Not on file  Tobacco Use   Smoking status: Every Day    Current packs/day: 1.00    Average packs/day: 1 pack/day for 14.0 years (14.0 ttl pk-yrs)    Types: Cigarettes   Smokeless tobacco: Never  Substance and Sexual Activity   Alcohol use: No    Comment: None in over a month   Drug use: Yes    Types: Crack cocaine    Comment: pt reports smoking crack this month   Sexual activity: Yes    Birth  control/protection: None, Condom  Other Topics Concern   Not on file  Social History Narrative   Not on file   Social Drivers of Health   Financial Resource Strain: Not on file  Food Insecurity: Patient Unable To Answer (10/16/2023)   Hunger Vital Sign    Worried About Running Out of Food in the Last Year: Patient unable to answer    Ran Out of Food in the Last Year: Patient unable to answer  Transportation Needs: Patient Unable To Answer (10/16/2023)   PRAPARE - Transportation    Lack of Transportation (Medical): Patient unable to answer  Lack of Transportation (Non-Medical): Patient unable to answer  Physical Activity: Not on file  Stress: Not on file  Social Connections: Not on file   Additional Social History:                         Sleep: Poor  Appetite:  Poor  Current Medications: Current Facility-Administered Medications  Medication Dose Route Frequency Provider Last Rate Last Admin   acetaminophen  (TYLENOL ) tablet 650 mg  650 mg Oral Q6H PRN Wilkie Majel RAMAN, FNP   650 mg at 10/16/23 2333   alum & mag hydroxide-simeth (MAALOX/MYLANTA) 200-200-20 MG/5ML suspension 30 mL  30 mL Oral Q4H PRN Wilkie Majel RAMAN, FNP       buPROPion  (WELLBUTRIN  XL) 24 hr tablet 150 mg  150 mg Oral Daily Tovah Slavick B, MD       clonazePAM  (KLONOPIN ) tablet 0.5 mg  0.5 mg Oral Daily PRN Terrill Wauters B, MD   0.5 mg at 10/17/23 1832   clonazePAM  (KLONOPIN ) tablet 1 mg  1 mg Oral BID Tiersa Dayley B, MD   1 mg at 10/18/23 9075   hydrOXYzine  (ATARAX ) tablet 25 mg  25 mg Oral Q6H PRN Wilkie Majel RAMAN, FNP   25 mg at 10/18/23 9073   loperamide  (IMODIUM ) capsule 2-4 mg  2-4 mg Oral PRN Wilkie Majel RAMAN, FNP       magnesium  hydroxide (MILK OF MAGNESIA) suspension 30 mL  30 mL Oral Daily PRN Wilkie Majel RAMAN, FNP   30 mL at 10/18/23 0925   multivitamin with minerals tablet 1 tablet  1 tablet Oral Daily Starkes-Perry, Takia S, FNP   1 tablet at 10/18/23 9074    nicotine  (NICODERM CQ  - dosed in mg/24 hours) patch 21 mg  21 mg Transdermal Daily Wilkie Majel RAMAN, FNP   21 mg at 10/18/23 9070   OLANZapine  (ZYPREXA ) injection 5 mg  5 mg Intramuscular TID PRN Harshika Mago B, MD       OLANZapine  zydis (ZYPREXA ) disintegrating tablet 5 mg  5 mg Oral TID PRN Wilkie Majel RAMAN, FNP   5 mg at 10/18/23 0016   ondansetron  (ZOFRAN -ODT) disintegrating tablet 4 mg  4 mg Oral Q6H PRN Wilkie Majel RAMAN, FNP       prazosin  (MINIPRESS ) capsule 1 mg  1 mg Oral BID Fransico Sciandra B, MD   1 mg at 10/18/23 9075   propranolol  (INDERAL ) tablet 10 mg  10 mg Oral BID Starkes-Perry, Takia S, FNP   10 mg at 10/18/23 9076   QUEtiapine  (SEROQUEL ) tablet 400 mg  400 mg Oral QHS Brentt Fread B, MD       thiamine  (Vitamin B-1) tablet 100 mg  100 mg Oral Daily Starkes-Perry, Takia S, FNP   100 mg at 10/18/23 9074    Lab Results:  Results for orders placed or performed during the hospital encounter of 10/14/23 (from the past 48 hours)  Glucose, capillary     Status: Abnormal   Collection Time: 10/16/23 11:53 AM  Result Value Ref Range   Glucose-Capillary 101 (H) 70 - 99 mg/dL    Comment: Glucose reference range applies only to samples taken after fasting for at least 8 hours.    Blood Alcohol level:  Lab Results  Component Value Date   [facility] <10 10/14/2023   ETH <10 03/23/2018    Metabolic Disorder Labs: Lab Results  Component Value Date   HGBA1C 5.1 10/15/2023   MPG 100 10/15/2023   No  results found for: PROLACTIN Lab Results  Component Value Date   CHOL 119 10/15/2023   TRIG 40 10/15/2023   HDL 48 10/15/2023   CHOLHDL 2.5 10/15/2023   VLDL 8 10/15/2023   LDLCALC 63 10/15/2023    Physical Findings: AIMS:  , ,  ,  ,    CIWA:    COWS:     Musculoskeletal: Strength & Muscle Tone: within normal limits Gait & Station: normal Patient leans: N/A  Psychiatric Specialty Exam:  Presentation  General Appearance:  Casual  Eye  Contact: Good  Speech: Clear and Coherent  Speech Volume: Increased  Handedness:No data recorded  Mood and Affect  Mood: Angry; Irritable  Affect: Congruent   Thought Process  Thought Processes: Linear  Descriptions of Associations:Intact  Orientation:Full (Time, Place and Person)  Thought Content:Illogical; Perseveration  History of Schizophrenia/Schizoaffective disorder:No data recorded Duration of Psychotic Symptoms:No data recorded Hallucinations:Hallucinations: None Description of Visual Hallucinations: saw things moving on her ceiling last night  Ideas of Reference:None  Suicidal Thoughts:Suicidal Thoughts: Yes, Passive SI Passive Intent and/or Plan: Without Means to Carry Out  Homicidal Thoughts:Homicidal Thoughts: No   Sensorium  Memory: Immediate Good; Recent Good  Judgment: Poor  Insight: Shallow   Executive Functions  Concentration: Poor  Attention Span: Poor  Recall: Poor  Fund of Knowledge: Poor  Language: Poor   Psychomotor Activity  Psychomotor Activity: Psychomotor Activity: Normal   Assets  Assets: Leisure Time   Sleep  Sleep: Sleep: Poor    Physical Exam: Physical Exam HENT:     Head: Normocephalic and atraumatic.  Pulmonary:     Effort: Pulmonary effort is normal.  Neurological:     Mental Status: She is alert and oriented to person, place, and time.    Review of Systems  Gastrointestinal:  Positive for constipation.  Psychiatric/Behavioral:  Positive for depression and suicidal ideas. Negative for hallucinations. The patient is nervous/anxious and has insomnia.    Blood pressure 103/78, pulse 92, temperature 97.6 F (36.4 C), temperature source Oral, resp. rate 18, height 5' 2 (1.575 m), weight 92.3 kg, SpO2 98%, unknown if currently breastfeeding. Body mass index is 37.2 kg/m.   Treatment Plan Summary: Daily contact with patient to assess and evaluate symptoms and progress in treatment and  Medication management  Patient continues to be labile and very irritable. Patient also appears to be very sarcastic and a bit resistant to cognitive related therapy, but she did request a chaplain consult, which is very appropriate given the trauma is grief related. Patient appeared triggered by groups and needs sometime to calm down, but hospitalization continues to be most appropriate for patient given endorsement of SI especially with worsened mood. There was some impulse control as patient did ask to speak with someone, but she continues to verbally lash out. Will start patient on Wellbutrin  XL to address dysphoria and also possible patient reported ADHD hx. Patient has mentioned multiple times feeling that cocaine and caffeine help calm her thoughts,  thus Dopamine action from Wellbutrin  should be helpful.    Physician Treatment Plan for Secondary Diagnosis: Principal Problem:   Bipolar I disorder with depression, severe (HCC) Active Problems:   Chronic post-traumatic stress disorder (PTSD)   Cocaine abuse with cocaine-induced mood disorder (HCC)   Intentional benzodiazepine overdose, initial encounter (HCC)   Hypokalemia   Intentional overdose (HCC)   Chronic prescription benzodiazepine use  Hx of ADHD  Continued IVC.   -- Increase Seroquel  from 300mg  at bedtime to 400mg  at bedtime,  for mood stabilization and insomnia --  Klonopin  to 1mg  BID, for chronic BZD prescription use -- 0.5mg  daily PRN Klonopin , for anxiety --  Prazosin  1mg  BID, nightmares and severe hypervigilance -- Continue Propanolol 10mg  BID -- Start Wellbutrin  XL 150mg  daily -- Chaplain consult   -- Thiamine  100mg  daily -- MV 1 tablet daily   PRN -Tylenol  650mg  q6h, pain -Maalox 30ml q4h, indigestion -Atarax  25mg  TID, anxiety -Milk of Mag 30mL, constipation   Agitation Protocol: Zyprexa  5mg  TID PRN PO or Zyprexa  5mg  IM TID PRN for moderate to severe agitation and refuses PO.   Safety and  Monitoring: INVOLUNTARY admission to inpatient psychiatric unit for safety, stabilization and treatment Daily contact with patient to assess and evaluate symptoms and progress in treatment Patient's case to be discussed in multi-disciplinary team meeting Observation Level : q15 minute checks Vital signs: q12 hours Precautions: suicide, but pt currently verbally contracts for safety on unit    Discharge Planning: Social work and case management to assist with discharge planning and identification of hospital follow-up needs prior to discharge Estimated LOS: 5-7 days Discharge Concerns: Need to establish a safety plan; Medication compliance and effectiveness Discharge Goals: Return home with outpatient referrals for mental health follow-up including medication management/psychotherapy.        Long Term Goal(s): Improvement in symptoms so as ready for discharge   Short Term Goals: Ability to identify changes in lifestyle to reduce recurrence of condition will improve, Ability to verbalize feelings will improve, Ability to disclose and discuss suicidal ideas, Ability to demonstrate self-control will improve, Ability to identify and develop effective coping behaviors will improve, Ability to maintain clinical measurements within normal limits will improve, Compliance with prescribed medications will improve, and Ability to identify triggers associated with substance abuse/mental health issues will improve   I certify that inpatient services furnished can reasonably be expected to improve the patient's condition.    Veida Spira B Saidah Kempton, MD 10/18/2023, 11:10 AM

## 2023-10-18 NOTE — Group Note (Signed)
 LCSW Group Therapy Note  Group Date: 10/18/2023 Start Time: 1000 End Time: 1100   Type of Therapy and Topic:  Group Therapy: Wellness/ Positive Affirmations  Participation Level:  Minimal   Description of Group:   This group addressed positive affirmation towards self and others.  Patients went around the room and identified two positive things about themselves and two positive things about a peer in the room.  Patients reflected on how it felt to share something positive with others, to identify positive things about themselves, and to hear positive things from others/ Patients were encouraged to have a daily reflection of positive characteristics or circumstances.   Therapeutic Goals: Patients will verbalize two of their positive qualities Patients will demonstrate empathy for others by stating two positive qualities about a peer in the group Patients will verbalize their feelings when voicing positive self affirmations and when voicing positive affirmations of others Patients will discuss the potential positive impact on their wellness/recovery of focusing on positive traits of self and others.  Summary of Patient Progress:  she engaged briefly in the discussion and completed handout . She was not able to identify positive affirmations about herself as well as other group members. Patient was observant throughout the entire session.  Therapeutic Modalities:   Cognitive Behavioral Therapy Motivational Interviewing    Golda Louder, LCSWA 10/18/2023  3:12 PM

## 2023-10-19 ENCOUNTER — Encounter (HOSPITAL_COMMUNITY): Payer: Self-pay | Admitting: Family

## 2023-10-19 ENCOUNTER — Encounter (HOSPITAL_COMMUNITY): Payer: Self-pay

## 2023-10-19 DIAGNOSIS — F314 Bipolar disorder, current episode depressed, severe, without psychotic features: Secondary | ICD-10-CM | POA: Diagnosis not present

## 2023-10-19 DIAGNOSIS — F603 Borderline personality disorder: Secondary | ICD-10-CM | POA: Diagnosis present

## 2023-10-19 MED ORDER — CLONAZEPAM 0.5 MG PO TABS
1.0000 mg | ORAL_TABLET | Freq: Three times a day (TID) | ORAL | Status: DC
  Administered 2023-10-19 – 2023-10-23 (×13): 1 mg via ORAL
  Filled 2023-10-19 (×13): qty 2

## 2023-10-19 MED ORDER — CLONAZEPAM 0.5 MG PO TABS: 1.0000 mg | ORAL_TABLET | Freq: Three times a day (TID) | ORAL | Status: DC

## 2023-10-19 NOTE — [facility] Group Notes (Signed)
 Adult Psychoeducational Group Note  Date:  10/19/2023 Time:  10:40 AM  Group Topic/Focus:  Goals Group:   The focus of this group is to help patients establish daily goals to achieve during treatment and discuss how the patient can incorporate goal setting into their daily lives to aide in recovery.  Participation Level:  Did Not Attend  Participation Quality:  na  Affect:  na  Cognitive:  na  Insight: na  Engagement in Group:  na  Modes of Intervention:  na  Additional Comments:   Did not attend group  Teairra Millar 10/19/2023, 10:40 AM

## 2023-10-19 NOTE — Group Note (Signed)
 Occupational Therapy Group Note  Group Topic:Coping Skills  Group Date: 10/19/2023 Start Time: 1430 End Time: 1509 Facilitators: Dot [place] MATSU, OT   Group Description: Group encouraged increased engagement and participation through discussion and activity focused on Coping Ahead. Patients were split up into teams and selected a card from a stack of positive coping strategies. Patients were instructed to act out/charade the coping skill for other peers to guess and receive points for their team. Discussion followed with a focus on identifying additional positive coping strategies and patients shared how they were going to cope ahead over the weekend while continuing hospitalization stay.  Therapeutic Goal(s): Identify positive vs negative coping strategies. Identify coping skills to be used during hospitalization vs coping skills outside of hospital/at home Increase participation in therapeutic group environment and promote engagement in treatment   Participation Level: Engaged   Participation Quality: Independent   Behavior: Appropriate   Speech/Thought Process: Relevant   Affect/Mood: Appropriate   Insight: Fair   Judgement: Fair      Modes of Intervention: Education  Patient Response to Interventions:  Engaged   Plan: Continue to engage patient in OT groups 2 - 3x/week.  10/19/2023  [place] MATSU Dot, OT  Abriella Filkins, OT
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
## 2023-10-19 NOTE — Progress Notes (Signed)
   10/19/23 2241  Psych Admission Type (Psych Patients Only)  Admission Status Involuntary  Psychosocial Assessment  Patient Complaints Irritability  Eye Contact Brief  Facial Expression Anxious  Affect Depressed;Anxious  Speech Pressured  Interaction Needy  Motor Activity Restless  Appearance/Hygiene Designer, Industrial/product Cooperative;Appropriate to situation  Mood Anxious  Thought Process  Coherency Disorganized  Content Blaming others  Delusions None reported or observed  Perception WDL  Hallucination None reported or observed  Judgment Poor  Confusion None  Danger to Self  Current suicidal ideation? Denies  Self-Injurious Behavior No self-injurious ideation or behavior indicators observed or expressed   Agreement Not to Harm Self Yes  Description of Agreement verbal  Danger to Others  Danger to Others None reported or observed

## 2023-10-19 NOTE — Progress Notes (Signed)
 Patient had been talking to MD.  Patient walked to med window and asked for klonipin 1 mg.  Patient was given this med at 90.   Patient stated MD planned to give her three klonopin  1 mg tabs throughout the day.  Patient was given zyprexa  5 mg for agitation which was noted in notebook.

## 2023-10-19 NOTE — Progress Notes (Signed)
 Patient took her second dose of klonipin 1 mg.  She said she was not upset.  Just wanted her klonipin which was given to her.  Told her next dose of klonipin 1 mg will be given at 5:00 pm   Nurse explained to her that we are not getting upset today, that we are here to help her, not hurt her.  That staff is here to help her in any way we can.

## 2023-10-19 NOTE — Plan of Care (Signed)
 Nurse discussed anxiety, depression and coping skills with patient.

## 2023-10-19 NOTE — Group Note (Signed)
 Recreation Therapy Group Note   Group Topic:Problem Solving  Group Date: 10/19/2023 Start Time: 0940 End Time: 1011 Facilitators: Airianna Kreischer-McCall, LRT,CTRS Location: 300 Hall Dayroom   Group Topic: Communication, Team Building, Problem Solving  Goal Area(s) Addresses:  Patient will effectively work with peer towards shared goal.  Patient will identify skills used to make activity successful.  Patient will identify how skills used during activity can be applied to reach post d/c goals.   Intervention: STEM Activity- Glass Blower/designer  Group Description: Tallest Pharmacist, Community. In teams of 5-6, patients were given 11 craft pipe cleaners. Using the materials provided, patients were instructed to compete again the opposing team(s) to build the tallest free-standing structure from floor level. The activity was timed; difficulty increased by clinical research associate as production designer, theatre/television/film continued.  Systematically resources were removed with additional directions for example, placing one arm behind their back, working in silence, and shape stipulations. LRT facilitated post-activity discussion reviewing team processes and necessary communication skills involved in completion. Patients were encouraged to reflect how the skills utilized, or not utilized, in this activity can be incorporated to positively impact support systems post discharge.  Education: Pharmacist, Community, Scientist, Physiological, Discharge Planning   Education Outcome: Acknowledges education/In group clarification offered/Needs additional education.    Affect/Mood: N/A   Participation Level: Did not attend    Clinical Observations/Individualized Feedback:      Plan: Continue to engage patient in RT group sessions 2-3x/week.   Kelsha Older-McCall, LRT,CTRS 10/19/2023 12:44 PM

## 2023-10-19 NOTE — Progress Notes (Signed)
 Pt complained of not being able to sleep. Pt stated Seroquel  is not helping despite the dosage increase. Pt complained of being started on anti depressant, she feel it slows her down. Gives her craving to use drugs and feeling of dying. Will continue to monitor.

## 2023-10-19 NOTE — Progress Notes (Signed)
 D:  Patient's self inventory sheet, patient had poor sleep, sleep medication not helpful.  Good appetite, low energy level, poor concentration.  Rated depression 6, hopeless 10, anxiety 9.  Denied withdrawals.  Denied SI.  Denied physical problems.  Denied physical pain.  No goal, only stay alive.  No discharge plans, homeless. A:  Medications administered per MD orders.  Emotional support and encouragement given patient. R:  Denied SI and HI, contracts for safety.  Denied A/V hallucinations.  Safety maintained with 15 minute checks.

## 2023-10-19 NOTE — BH IP Treatment Plan (Signed)
 Interdisciplinary Treatment and Diagnostic Plan Update  10/19/2023 Time of Session: 11:10AM Jenna Wilson MRN: [id number]  Principal Diagnosis: Bipolar I disorder with depression, severe (HCC)  Secondary Diagnoses: Principal Problem:   Bipolar I disorder with depression, severe (HCC) Active Problems:   Chronic post-traumatic stress disorder (PTSD)   Cocaine abuse with cocaine-induced mood disorder (HCC)   Intentional benzodiazepine overdose, initial encounter (HCC)   Hypokalemia   Intentional overdose (HCC)   Borderline personality disorder (HCC)   Current Medications:  Current Facility-Administered Medications  Medication Dose Route Frequency Provider Last Rate Last Admin   acetaminophen  (TYLENOL ) tablet 650 mg  650 mg Oral Q6H PRN Starkes-Perry, Takia S, FNP   650 mg at 10/16/23 2333   alum & mag hydroxide-simeth (MAALOX/MYLANTA) 200-200-20 MG/5ML suspension 30 mL  30 mL Oral Q4H PRN Wilkie Majel RAMAN, FNP       buPROPion  (WELLBUTRIN  XL) 24 hr tablet 150 mg  150 mg Oral Daily McQuilla, Jai B, MD   150 mg at 10/19/23 1056   clonazePAM  (KLONOPIN ) tablet 1 mg  1 mg Oral TID Parker, Alvin S, MD   1 mg at 10/19/23 1301   magnesium  hydroxide (MILK OF MAGNESIA) suspension 30 mL  30 mL Oral Daily PRN Wilkie Majel RAMAN, FNP   30 mL at 10/18/23 0925   multivitamin with minerals tablet 1 tablet  1 tablet Oral Daily Starkes-Perry, Takia S, FNP   1 tablet at 10/19/23 0755   nicotine  (NICODERM CQ  - dosed in mg/24 hours) patch 21 mg  21 mg Transdermal Daily Wilkie Majel RAMAN, FNP   21 mg at 10/19/23 0757   OLANZapine  (ZYPREXA ) injection 5 mg  5 mg Intramuscular TID PRN McQuilla, Jai B, MD       OLANZapine  zydis (ZYPREXA ) disintegrating tablet 5 mg  5 mg Oral TID PRN Starkes-Perry, Takia S, FNP   5 mg at 10/19/23 1102   polyethylene glycol (MIRALAX  / GLYCOLAX ) packet 17 g  17 g Oral Daily McQuilla, Jai B, MD   17 g at 10/19/23 9241   prazosin  (MINIPRESS ) capsule 1 mg  1 mg Oral BID  McQuilla, Jai B, MD   1 mg at 10/19/23 0755   propranolol  (INDERAL ) tablet 10 mg  10 mg Oral BID Starkes-Perry, Takia S, FNP   10 mg at 10/19/23 9244   QUEtiapine  (SEROQUEL ) tablet 400 mg  400 mg Oral QHS McQuilla, Jai B, MD   400 mg at 10/18/23 2050   thiamine  (Vitamin B-1) tablet 100 mg  100 mg Oral Daily Starkes-Perry, Takia S, FNP   100 mg at 10/19/23 9244   PTA Medications: Medications Prior to Admission  Medication Sig Dispense Refill Last Dose/Taking   acetaminophen  (TYLENOL ) 325 MG tablet Take 2 tablets (650 mg total) by mouth every 6 (six) hours as needed for mild pain (pain score 1-3) (or Fever >/= 101).      hydrOXYzine  (ATARAX ) 25 MG tablet Take 1 tablet (25 mg total) by mouth every 6 (six) hours as needed (anxiety/agitation or CIWA < or = 10).      LORazepam  (ATIVAN ) 1 MG tablet Take 1 tablet (1 mg total) by mouth every 6 (six) hours as needed (CIWA > 10).      Multiple Vitamin (MULTIVITAMIN WITH MINERALS) TABS tablet Take 1 tablet by mouth daily.      nicotine  (NICODERM CQ  - DOSED IN MG/24 HOURS) 21 mg/24hr patch Place 1 patch (21 mg total) onto the skin daily.      propranolol  (INDERAL ) 10  MG tablet Take 10 mg by mouth 2 (two) times daily.      QUEtiapine  (SEROQUEL ) 200 MG tablet Take 1 tablet (200 mg total) by mouth at bedtime. 30 tablet 3    QUEtiapine  (SEROQUEL ) 50 MG tablet Take 50 mg by mouth at bedtime.      thiamine  (VITAMIN B-1) 100 MG tablet Take 1 tablet (100 mg total) by mouth daily.       Patient Stressors: Financial difficulties   Marital or family conflict   Substance abuse    Patient Strengths: Capable of independent living  General fund of knowledge   Treatment Modalities: Medication Management, Group therapy, Case management,  1 to 1 session with clinician, Psychoeducation, Recreational therapy.   Physician Treatment Plan for Primary Diagnosis: Bipolar I disorder with depression, severe (HCC) Long Term Goal(s): Improvement in symptoms so as ready for  discharge   Short Term Goals: Ability to identify changes in lifestyle to reduce recurrence of condition will improve Ability to verbalize feelings will improve Ability to disclose and discuss suicidal ideas Ability to demonstrate self-control will improve Ability to identify and develop effective coping behaviors will improve Ability to maintain clinical measurements within normal limits will improve Compliance with prescribed medications will improve Ability to identify triggers associated with substance abuse/mental health issues will improve  Medication Management: Evaluate patient's response, side effects, and tolerance of medication regimen.  Therapeutic Interventions: 1 to 1 sessions, Unit Group sessions and Medication administration.  Evaluation of Outcomes: Not Progressing  Physician Treatment Plan for Secondary Diagnosis: Principal Problem:   Bipolar I disorder with depression, severe (HCC) Active Problems:   Chronic post-traumatic stress disorder (PTSD)   Cocaine abuse with cocaine-induced mood disorder (HCC)   Intentional benzodiazepine overdose, initial encounter (HCC)   Hypokalemia   Intentional overdose (HCC)   Borderline personality disorder (HCC)  Long Term Goal(s): Improvement in symptoms so as ready for discharge   Short Term Goals: Ability to identify changes in lifestyle to reduce recurrence of condition will improve Ability to verbalize feelings will improve Ability to disclose and discuss suicidal ideas Ability to demonstrate self-control will improve Ability to identify and develop effective coping behaviors will improve Ability to maintain clinical measurements within normal limits will improve Compliance with prescribed medications will improve Ability to identify triggers associated with substance abuse/mental health issues will improve     Medication Management: Evaluate patient's response, side effects, and tolerance of medication  regimen.  Therapeutic Interventions: 1 to 1 sessions, Unit Group sessions and Medication administration.  Evaluation of Outcomes: Not Progressing   RN Treatment Plan for Primary Diagnosis: Bipolar I disorder with depression, severe (HCC) Long Term Goal(s): Knowledge of disease and therapeutic regimen to maintain health will improve  Short Term Goals: Ability to remain free from injury will improve, Ability to verbalize frustration and anger appropriately will improve, Ability to demonstrate self-control, Ability to participate in decision making will improve, Ability to verbalize feelings will improve, Ability to disclose and discuss suicidal ideas, Ability to identify and develop effective coping behaviors will improve, and Compliance with prescribed medications will improve  Medication Management: RN will administer medications as ordered by provider, will assess and evaluate patient's response and provide education to patient for prescribed medication. RN will report any adverse and/or side effects to prescribing provider.  Therapeutic Interventions: 1 on 1 counseling sessions, Psychoeducation, Medication administration, Evaluate responses to treatment, Monitor vital signs and CBGs as ordered, Perform/monitor CIWA, COWS, AIMS and Fall Risk screenings as ordered, Perform wound  care treatments as ordered.  Evaluation of Outcomes: Not Progressing   LCSW Treatment Plan for Primary Diagnosis: Bipolar I disorder with depression, severe (HCC) Long Term Goal(s): Safe transition to appropriate next level of care at discharge, Engage patient in therapeutic group addressing interpersonal concerns.  Short Term Goals: Engage patient in aftercare planning with referrals and resources, Increase social support, Increase ability to appropriately verbalize feelings, Increase emotional regulation, Facilitate acceptance of mental health diagnosis and concerns, Facilitate patient progression through stages of  change regarding substance use diagnoses and concerns, Identify triggers associated with mental health/substance abuse issues, and Increase skills for wellness and recovery  Therapeutic Interventions: Assess for all discharge needs, 1 to 1 time with Social worker, Explore available resources and support systems, Assess for adequacy in community support network, Educate family and significant other(s) on suicide prevention, Complete Psychosocial Assessment, Interpersonal group therapy.  Evaluation of Outcomes: Not Progressing   Progress in Treatment: Attending groups: No. Participating in groups: No. Taking medication as prescribed: Yes. Toleration medication: Yes. Family/Significant other contact made: Yes, individual(s) contacted:  [name] [phone] cell or [phone] work Patient understands diagnosis: Yes. Discussing patient identified problems/goals with staff: Yes. Medical problems stabilized or resolved: Yes. Denies suicidal/homicidal ideation: Yes. Issues/concerns per patient self-inventory: No.  New problem(s) identified: No, Describe:  none  New Short Term/Long Term Goal(s): medication stabilization, elimination of SI thoughts, development of comprehensive mental wellness plan.    Patient Goals:  I need a place to live  Discharge Plan or Barriers: Patient recently admitted. CSW will continue to follow and assess for appropriate referrals and possible discharge planning.    Reason for Continuation of Hospitalization: Anxiety Depression Medication stabilization Suicidal ideation  Estimated Length of Stay: 5-7 days  Last 3 [place] Suicide Severity Risk Score: Flowsheet Row Admission (Current) [facility]-Admission (Discharged) from 10/14/2023 in [facility] from 10/17/2022 in [facility]  C-SSRS RISK CATEGORY High  Risk High Risk No Risk       Last PHQ 2/9 Scores:     No data to display          Scribe for Treatment Team: Jenkins LULLA Primer, LCSWA 10/19/2023 1:35 PM

## 2023-10-19 NOTE — Progress Notes (Signed)
   10/18/23 2325  Psych Admission Type (Psych Patients Only)  Admission Status Involuntary  Psychosocial Assessment  Patient Complaints Restlessness  Eye Contact Brief  Facial Expression Anxious  Affect Depressed;Anxious  Speech Pressured  Interaction Needy  Motor Activity Restless  Appearance/Hygiene Disheveled  Behavior Characteristics Anxious;Irritable  Mood Depressed;Anxious  Thought Process  Coherency Disorganized  Content Blaming others  Delusions None reported or observed  Perception WDL  Hallucination None reported or observed  Judgment Poor  Confusion None  Danger to Self  Current suicidal ideation? Denies  Self-Injurious Behavior No self-injurious ideation or behavior indicators observed or expressed   Agreement Not to Harm Self Yes  Description of Agreement verbal  Danger to Others  Danger to Others None reported or observed

## 2023-10-19 NOTE — [facility] Group Notes (Signed)
 [facility] Group Notes:  (Nursing/MHT/Case Management/Adjunct)  Date:  10/19/2023  Time:  2000  Type of Therapy:   wrap up group  Participation Level:  Active  Participation Quality:  Appropriate and Attentive  Affect:  Appropriate  Cognitive:  Alert and Appropriate  Insight:  Appropriate  Engagement in Group:  Engaged  Modes of Intervention:  Discussion and Support  Summary of Progress/Problems: Pt rate day 7. Pt stated she felt  tired and sleepy all day. But state she did achieve her goal by getting medicine on track today.  Jenna Wilson [name] 10/19/2023, 9:15 PM

## 2023-10-20 MED ORDER — QUETIAPINE FUMARATE 100 MG PO TABS
100.0000 mg | ORAL_TABLET | Freq: Two times a day (BID) | ORAL | Status: DC | PRN
  Administered 2023-10-22: 100 mg via ORAL
  Filled 2023-10-20: qty 1

## 2023-10-20 MED ORDER — MAGNESIUM CITRATE PO SOLN
1.0000 | Freq: Once | ORAL | Status: AC
  Administered 2023-10-20: 1 via ORAL
  Filled 2023-10-20: qty 296

## 2023-10-20 NOTE — Progress Notes (Signed)
 Chaplain acknowledges spiritual care consult for [facility] requesting prayer.  Due to her agitation today, chaplain will hold off on consult until tomorrow.  [address] Bcc Pager, [phone]

## 2023-10-20 NOTE — Group Note (Signed)
 Date:  10/20/2023 Time:  8:47 PM  Group Topic/Focus:  Wrap-Up Group:   The focus of this group is to help patients review their daily goal of treatment and discuss progress on daily workbooks.    Participation Level:  Did Not Attend  Participation Quality:   Did Not Attend  Affect:   Did Not Attend  Cognitive:   Did Not Attend  Insight: None  Engagement in Group:   Did Not Attend  Modes of Intervention:   Did Not Attend  Additional Comments:  Pt was encouraged to attend wrap up group but did not attend.  Lonni Na 10/20/2023, 8:47 PM

## 2023-10-20 NOTE — Plan of Care (Signed)
  Problem: Education: Goal: Knowledge of  General Education information/materials will improve Outcome: Progressing Goal: Emotional status will improve Outcome: Progressing Goal: Mental status will improve Outcome: Progressing Goal: Verbalization of understanding the information provided will improve Outcome: Progressing   Problem: Activity: Goal: Sleeping patterns will improve Outcome: Progressing   Problem: Coping: Goal: Ability to verbalize frustrations and anger appropriately will improve Outcome: Progressing

## 2023-10-20 NOTE — Group Note (Signed)
 LCSW Group Therapy Note   Group Date: 10/20/2023 Start Time: 1100 End Time: 1200   Type of Therapy and Topic:  Group Therapy   Participation:  did not attend   Topic:  Effective Communication Skills  Goal of the Session: The goal of today's group session was to improve communication skills among participants, helping them express themselves more clearly, listen actively, and better understand others. This will contribute to healthier relationships and more effective problem-solving within the group and in daily life.  Objectives of the Session: To enhance active listening skills by engaging participants in exercises that improve their ability to focus on and understand others' points of view. To practice the use of I statements to help participants express their thoughts and feelings without sounding accusatory or confrontational. To discuss the importance of body language and other non-verbal communication cues and how they impact conversations. To help participants recognize the role of emotions in communication and develop strategies for managing emotions during conversations.  Summary:   In today's group session on communication, we began by discussing the importance of effective communication. We reviewed the different types of communication.  We then introduced the concept of active listening and "I" statements and practiced new communication skills using examples.  Participants learned how emotions can influence conversations and practiced techniques for managing strong emotions during discussions.  Participants were encouraged to reflect on one thing they would like to improve in their own communication and practice that in the coming week.  Therapeutic Modalities:  Elements of DBT   Catherene MALVA Dynes, LCSWA 10/20/2023  12:32 PM

## 2023-10-20 NOTE — Plan of Care (Signed)
  Problem: Activity: Goal: Interest or engagement in activities will improve Outcome: Progressing Goal: Sleeping patterns will improve Outcome: Progressing   Problem: Safety: Goal: Periods of time without injury will increase Outcome: Progressing

## 2023-10-20 NOTE — Plan of Care (Signed)
   Problem: Education: Goal: Knowledge of [facility] Education information/materials will improve Outcome: Progressing   Problem: Activity: Goal: Interest or engagement in activities will improve Outcome: Progressing   Problem: Coping: Goal: Ability to verbalize frustrations and anger appropriately will improve Outcome: Progressing   Problem: Safety: Goal: Periods of time without injury will increase Outcome: Progressing

## 2023-10-20 NOTE — Group Note (Signed)
 Date:  10/20/2023 Time:  10:25 AM  Group Topic/Focus:  Goals Group:   The focus of this group is to help patients establish daily goals to achieve during treatment and discuss how the patient can incorporate goal setting into their daily lives to aide in recovery. Orientation:   The focus of this group is to educate the patient on the purpose and policies of crisis stabilization and provide a format to answer questions about their admission.  The group details unit policies and expectations of patients while admitted.    Participation Level:  Did Not Attend    Jenna Wilson 10/20/2023, 10:25 AM

## 2023-10-20 NOTE — Progress Notes (Signed)
   10/20/23 1036  Psych Admission Type (Psych Patients Only)  Admission Status Involuntary  Psychosocial Assessment  Patient Complaints Irritability;Anger  Eye Contact Glaring  Facial Expression Angry  Affect Angry  Speech Pressured  Interaction Needy  Motor Activity Restless  Appearance/Hygiene Disheveled  Behavior Characteristics Agressive verbally  Mood Angry  Thought Process  Coherency Disorganized  Content Blaming others  Delusions None reported or observed  Perception WDL  Hallucination None reported or observed  Judgment Poor  Confusion None  Danger to Self  Current suicidal ideation? Denies  Self-Injurious Behavior No self-injurious ideation or behavior indicators observed or expressed   Agreement Not to Harm Self Yes  Description of Agreement verbal  Danger to Others  Danger to Others None reported or observed
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
## 2023-10-20 NOTE — Progress Notes (Signed)
   10/20/23 2300  Psych Admission Type (Psych Patients Only)  Admission Status Involuntary  Psychosocial Assessment  Patient Complaints Anxiety  Eye Contact Brief  Facial Expression Anxious  Affect Appropriate to circumstance  Speech Soft  Interaction Attention-seeking;Needy  Motor Activity Slow  Appearance/Hygiene Disheveled  Behavior Characteristics Cooperative  Mood Anxious  Thought Process  Coherency WDL  Content Blaming others  Delusions None reported or observed  Perception WDL  Hallucination None reported or observed  Judgment Poor  Confusion None  Danger to Self  Current suicidal ideation? Denies  Description of Suicide Plan None  Self-Injurious Behavior No self-injurious ideation or behavior indicators observed or expressed   Agreement Not to Harm Self Yes  Description of Agreement Verbal contract for safety  Danger to Others  Danger to Others None reported or observed

## 2023-10-21 MED ORDER — MELATONIN 5 MG PO TABS
10.0000 mg | ORAL_TABLET | Freq: Every day | ORAL | Status: DC
  Administered 2023-10-21 – 2023-10-22 (×2): 10 mg via ORAL
  Filled 2023-10-21 (×5): qty 2

## 2023-10-21 NOTE — Progress Notes (Signed)
   10/21/23 0805  Psych Admission Type (Psych Patients Only)  Admission Status Involuntary  Psychosocial Assessment  Patient Complaints Anxiety  Eye Contact Brief  Facial Expression Anxious  Affect Appropriate to circumstance  Speech Logical/coherent  Interaction Assertive  Motor Activity Other (Comment) (WDL)  Appearance/Hygiene Disheveled  Behavior Characteristics Cooperative  Mood Anxious  Thought Process  Coherency WDL  Content Blaming others  Delusions None reported or observed  Perception WDL  Hallucination None reported or observed  Judgment Poor  Confusion None  Danger to Self  Current suicidal ideation? Denies  Agreement Not to Harm Self Yes  Description of Agreement Verbal  Danger to Others  Danger to Others None reported or observed

## 2023-10-21 NOTE — Progress Notes (Signed)
 Adult Psychoeducational Group Note  Date:  10/21/2023 Time:  8:35 PM  Group Topic/Focus:  Wrap-Up Group:   The focus of this group is to help patients review their daily goal of treatment and discuss progress on daily workbooks.  Participation Level:  Active  Participation Quality:  Appropriate  Affect:  Appropriate  Cognitive:  Appropriate  Insight: Appropriate  Engagement in Group:  Engaged  Modes of Intervention:  Limit-setting  Additional Comments:  Fenton said she would rather be in jail than here. [facility] is better for her. Her goal to get out of here Friday or Saturday  Lang Donia Law 10/21/2023, 8:35 PM

## 2023-10-21 NOTE — Progress Notes (Addendum)
 [facility] MD Progress Note  10/21/2023 11:37 AM Jenna Wilson  MRN:  [id number] Subjective:   Jenna Wilson is a 33 yo patient with a PPH of Bipolar d/o, PTSD, GAD, Stimulant use d/o-cocaine, Postpartum depression, and ADHD who presented to [facility] via EMS after intentional OD on Klonopin  and Seroquel  ( EMR notes that patient had some inconsistently in reports on what she took). Patient transferred to [facility] after being declared medical stable.   Per nursing report, patient behaviors improved with transition to [address].  No further PRNs needed after a.m. Zyprexa .  Patient had BM.  On initial assessment today patient reports that she prefers the new unit she is on.  Patient reports she is no longer worried about people laughing about her behind her back.  Patient endorses that she is all right but also endorses some irritability as she feels that not enough is being done to help her out of homelessness.  Patient reports that she is not having SI, HI or AVH and endorses that she feels her medications are helping her feel less dysphoric.  Patient reports that she does think going back to live in her car may increase her chances of relapse.  Patient reports that she is finally sleeping better and feels that this is also helping her concentration.  Patient endorses good appetite and has goals to work on disposition plan and possibly attend groups today.   Principal Problem: Bipolar I disorder with depression, severe (HCC) Diagnosis: Principal Problem:   Bipolar I disorder with depression, severe (HCC) Active Problems:   Chronic post-traumatic stress disorder (PTSD)   Cocaine abuse with cocaine-induced mood disorder (HCC)   Intentional benzodiazepine overdose, initial encounter (HCC)   Hypokalemia   Intentional overdose (HCC)   Borderline personality disorder (HCC)  Total Time spent with patient: 20 minutes  Past Psychiatric History: INPT: Multiple hospitalization including 2018 in [facility], endorses first hospital  occurred around age [age] yo OPT: Current, NP Tinnie Garret with [facility] Therapy: Yes in the past including EMDR Previous meds: Prazosin , Klonopin , Seroquel , Propanolol, Lithium  (became hypersexual on this), reports all typical antidepressants worsen my SI endorses she has been on remeron, Lexapro, Prozac, Zoloft , buspar  (made her lightheaded). Denies Effexor or Cymbalta or wellbutrin   Past Medical History:  Past Medical History:  Diagnosis Date   Anxiety    Bipolar 1 disorder (HCC)    Borderline personality disorder (HCC)    Chronic post-traumatic stress disorder (PTSD)    Depression    History of psychiatric hospitalization    Hx of drug abuse (HCC)    past heroin addict   Insomnia    Suicide attempt (HCC)     Past Surgical History:  Procedure Laterality Date   NO PAST SURGERIES     Family History:  Family History  Problem Relation Age of Onset   Alcohol abuse Neg Hx    Arthritis Neg Hx    Asthma Neg Hx    Birth defects Neg Hx    Cancer Neg Hx    COPD Neg Hx    Depression Neg Hx    Diabetes Neg Hx    Drug abuse Neg Hx    Early death Neg Hx    Hearing loss Neg Hx    Heart disease Neg Hx    Hyperlipidemia Neg Hx    Hypertension Neg Hx    Kidney disease Neg Hx    Learning disabilities Neg Hx    Mental illness Neg Hx    Mental retardation  Neg Hx    Miscarriages / Stillbirths Neg Hx    Stroke Neg Hx    Vision loss Neg Hx    Varicose Veins Neg Hx    Family Psychiatric  History: Mom: Anxiety Younger brother: SUD> died from OD Social History:  Social History   Substance and Sexual Activity  Alcohol Use No   Comment: None in over a month     Social History   Substance and Sexual Activity  Drug Use Yes   Types: Crack cocaine   Comment: pt reports smoking crack this month    Social History   Socioeconomic History   Marital status: Married    Spouse name: Not on file   Number of children: Not on file   Years of education: Not on file   Highest  education level: Not on file  Occupational History   Not on file  Tobacco Use   Smoking status: Every Day    Current packs/day: 1.00    Average packs/day: 1 pack/day for 14.0 years (14.0 ttl pk-yrs)    Types: Cigarettes   Smokeless tobacco: Never  Substance and Sexual Activity   Alcohol use: No    Comment: None in over a month   Drug use: Yes    Types: Crack cocaine    Comment: pt reports smoking crack this month   Sexual activity: Yes    Birth control/protection: None, Condom  Other Topics Concern   Not on file  Social History Narrative   Not on file   Social Drivers of Health   Financial Resource Strain: Not on file  Food Insecurity: Patient Unable To Answer (10/16/2023)   Hunger Vital Sign    Worried About Running Out of Food in the Last Year: Patient unable to answer    Ran Out of Food in the Last Year: Patient unable to answer  Transportation Needs: Patient Unable To Answer (10/16/2023)   PRAPARE - Administrator, Civil Service (Medical): Patient unable to answer    Lack of Transportation (Non-Medical): Patient unable to answer  Physical Activity: Not on file  Stress: Not on file  Social Connections: Not on file   Additional Social History:                         Sleep: Poor  Appetite:  Poor  Current Medications: Current Facility-Administered Medications  Medication Dose Route Frequency Provider Last Rate Last Admin   acetaminophen  (TYLENOL ) tablet 650 mg  650 mg Oral Q6H PRN Wilkie Majel RAMAN, FNP   650 mg at 10/16/23 2333   alum & mag hydroxide-simeth (MAALOX/MYLANTA) 200-200-20 MG/5ML suspension 30 mL  30 mL Oral Q4H PRN Wilkie Majel RAMAN, FNP       buPROPion  (WELLBUTRIN  XL) 24 hr tablet 150 mg  150 mg Oral Daily Melanye Hiraldo B, MD   150 mg at 10/21/23 0756   clonazePAM  (KLONOPIN ) tablet 1 mg  1 mg Oral TID Parker, Alvin S, MD   1 mg at 10/21/23 0756   magnesium  hydroxide (MILK OF MAGNESIA) suspension 30 mL  30 mL Oral Daily  PRN Wilkie Majel RAMAN, FNP   30 mL at 10/19/23 2110   multivitamin with minerals tablet 1 tablet  1 tablet Oral Daily Starkes-Perry, Takia S, FNP   1 tablet at 10/21/23 0756   nicotine  (NICODERM CQ  - dosed in mg/24 hours) patch 21 mg  21 mg Transdermal Daily Wilkie Majel RAMAN, FNP   21 mg  at 10/21/23 0757   OLANZapine  (ZYPREXA ) injection 5 mg  5 mg Intramuscular TID PRN Shan Valdes B, MD       polyethylene glycol (MIRALAX  / GLYCOLAX ) packet 17 g  17 g Oral Daily Chizaram Latino B, MD   17 g at 10/20/23 0756   prazosin  (MINIPRESS ) capsule 1 mg  1 mg Oral BID Keyden Pavlov B, MD   1 mg at 10/21/23 0756   propranolol  (INDERAL ) tablet 10 mg  10 mg Oral BID Starkes-Perry, Takia S, FNP   10 mg at 10/21/23 0756   QUEtiapine  (SEROQUEL ) tablet 100 mg  100 mg Oral BID PRN Kamrynn Melott B, MD       QUEtiapine  (SEROQUEL ) tablet 400 mg  400 mg Oral QHS Paublo Warshawsky B, MD   400 mg at 10/20/23 2043   thiamine  (Vitamin B-1) tablet 100 mg  100 mg Oral Daily Starkes-Perry, Takia S, FNP   100 mg at 10/21/23 9243    Lab Results:  No results found for this or any previous visit (from the past 48 hours).   Blood Alcohol level:  Lab Results  Component Value Date   ETH <10 10/14/2023   ETH <10 03/23/2018    Metabolic Disorder Labs: Lab Results  Component Value Date   HGBA1C 5.1 10/15/2023   MPG 100 10/15/2023   No results found for: PROLACTIN Lab Results  Component Value Date   CHOL 119 10/15/2023   TRIG 40 10/15/2023   HDL 48 10/15/2023   CHOLHDL 2.5 10/15/2023   VLDL 8 10/15/2023   LDLCALC 63 10/15/2023    Physical Findings: AIMS:  , ,  ,  ,    CIWA:    COWS:     Musculoskeletal: Strength & Muscle Tone: within normal limits Gait & Station: normal Patient leans: N/A  Psychiatric Specialty Exam:  Presentation  General Appearance:  Appropriate for Environment  Eye Contact: Fair  Speech: Clear and Coherent  Speech Volume: Normal  Handedness:No data recorded  Mood  and Affect  Mood: Irritable  Affect: Congruent   Thought Process  Thought Processes: Linear  Descriptions of Associations:Intact  Orientation:Full (Time, Place and Person)  Thought Content:Logical  History of Schizophrenia/Schizoaffective disorder:No data recorded Duration of Psychotic Symptoms:No data recorded Hallucinations:Hallucinations: None  Ideas of Reference:None  Suicidal Thoughts:Suicidal Thoughts: No  Homicidal Thoughts:Homicidal Thoughts: No   Sensorium  Memory: Immediate Good; Recent Good  Judgment: -- (Improving)  Insight: Shallow   Executive Functions  Concentration: Fair  Attention Span: Fair  Recall: Fair  Fund of Knowledge: Good  Language: Good   Psychomotor Activity  Psychomotor Activity: Psychomotor Activity: Normal   Assets  Assets: Resilience   Sleep  Sleep: Sleep: Good Number of Hours of Sleep: 7.5    Physical Exam: Physical Exam Constitutional:      Appearance: Normal appearance.  HENT:     Head: Normocephalic and atraumatic.  Pulmonary:     Effort: Pulmonary effort is normal.  Neurological:     Mental Status: She is alert and oriented to person, place, and time.    Review of Systems  Gastrointestinal:  Positive for constipation.  Psychiatric/Behavioral:  Positive for depression. Negative for hallucinations and suicidal ideas. The patient is nervous/anxious. The patient does not have insomnia.    Blood pressure 110/76, pulse 87, temperature (!) 97.5 F (36.4 C), temperature source Oral, resp. rate 18, height 5' 2 (1.575 m), weight 92.3 kg, SpO2 100%, unknown if currently breastfeeding. Body mass index is 37.2 kg/m.  Treatment Plan Summary: Daily contact with patient to assess and evaluate symptoms and progress in treatment and Medication management  Zariel Capano is a 33 y.o. female who has a past medical history of Anxiety, Bipolar 1 disorder (HCC), Borderline personality disorder (HCC),  Chronic post-traumatic stress disorder (PTSD), Depression, History of psychiatric hospitalization, drug abuse (HCC), Insomnia, and Suicide attempt (HCC). She presented from an outside hospital for Bipolar I disorder with depression, severe (HCC) [F31.4].    Patient less labile on the [address] high acuity unit.  Patient still endorsing some irritability but overall feels that her medications have decreased dysphoria and her sleep has improved.  Patient's concentration continues to improve as she is able to do some activities throughout the day.  Physician Treatment Plan for Secondary Diagnosis: Principal Problem:   Bipolar I disorder with depression, severe (HCC) Active Problems:   Chronic post-traumatic stress disorder (PTSD)   Cocaine abuse with cocaine-induced mood disorder (HCC)   Intentional benzodiazepine overdose, initial encounter (HCC)   Hypokalemia   Intentional overdose (HCC)   Chronic prescription benzodiazepine use  Hx of ADHD  Continued IVC.   -- Continue Seroquel  400mg  at bedtime, for mood stabilization and insomnia -- Continue Klonopin  to 1mg  TID, for anxiety -- 0.5mg  daily PRN Klonopin , for anxiety --  Prazosin  1mg  BID, nightmares and severe hypervigilance -- Continue Propanolol 10mg  BID -- Continue Wellbutrin  XL 150mg  daily -- Chaplain consult --Mag citrate, constipation (completed 10/20/2023 did have bowel movement)   -- Thiamine  100mg  daily -- MV 1 tablet daily   PRN -Tylenol  650mg  q6h, pain -Maalox 30ml q4h, indigestion -Atarax  25mg  TID, anxiety -Milk of Mag 30mL, constipation   Agitation Protocol: Seroquel  100 mg twice daily as needed or Zyprexa  5mg  IM TID PRN for moderate to severe agitation and refuses PO.   Safety and Monitoring: INVOLUNTARY admission to inpatient psychiatric unit for safety, stabilization and treatment Daily contact with patient to assess and evaluate symptoms and progress in treatment Patient's case to be discussed in  multi-disciplinary team meeting Observation Level : q15 minute checks Vital signs: q12 hours Precautions: suicide, but pt currently verbally contracts for safety on unit    Discharge Planning: Social work and case management to assist with discharge planning and identification of hospital follow-up needs prior to discharge Estimated LOS: 5-7 days Discharge Concerns: Need to establish a safety plan; Medication compliance and effectiveness Discharge Goals: Return home with outpatient referrals for mental health follow-up including medication management/psychotherapy.        Long Term Goal(s): Improvement in symptoms so as ready for discharge   Short Term Goals: Ability to identify changes in lifestyle to reduce recurrence of condition will improve, Ability to verbalize feelings will improve, Ability to disclose and discuss suicidal ideas, Ability to demonstrate self-control will improve, Ability to identify and develop effective coping behaviors will improve, Ability to maintain clinical measurements within normal limits will improve, Compliance with prescribed medications will improve, and Ability to identify triggers associated with substance abuse/mental health issues will improve   I certify that inpatient services furnished can reasonably be expected to improve the patient's condition.   PGY-4 Reggie KATHEE Rice, MD 10/21/2023, 11:37 AM

## 2023-10-21 NOTE — Progress Notes (Signed)
 Patient became agitated when she asked for PRN seroquel  at 1430 but this RN told her that due to her sedated presentation and recent administration of klonopin , seroquel  could not be administered. Another patient on the unit complained of chest pain during this time. Patient began yelling at staff in the dayroom while staff attended to other patient. Patient then reported to fellow RN that she was experiencing chest pain. Vitals assessed and provider notified. Chest xray and EKG ordered. EKG performed. When [employer] arrived to transport patient to xray, patient stated that she was refusing transport. MD notified. Patient denied chest pain at this time. Safety checks continue. Patient remains safe at this time.

## 2023-10-21 NOTE — Progress Notes (Signed)
   10/21/23 2200  Psych Admission Type (Psych Patients Only)  Admission Status Involuntary  Psychosocial Assessment  Patient Complaints Anxiety  Eye Contact Brief  Facial Expression Anxious  Affect Appropriate to circumstance  Speech Soft  Interaction Attention-seeking;Needy  Motor Activity Slow  Appearance/Hygiene Unremarkable  Behavior Characteristics Cooperative  Mood Anxious  Thought Process  Coherency WDL  Content Blaming others  Delusions None reported or observed  Perception WDL  Hallucination None reported or observed  Judgment Poor  Confusion None  Danger to Self  Current suicidal ideation? Denies  Self-Injurious Behavior No self-injurious ideation or behavior indicators observed or expressed   Agreement Not to Harm Self Yes  Description of Agreement Verbal contract for safety  Danger to Others  Danger to Others None reported or observed

## 2023-10-21 NOTE — Plan of Care (Signed)
   Problem: Education: Goal: Emotional status will improve Outcome: Progressing Goal: Mental status will improve Outcome: Progressing Goal: Verbalization of understanding the information provided will improve Outcome: Progressing

## 2023-10-21 NOTE — Group Note (Signed)
 Recreation Therapy Group Note   Group Topic:Health and Wellness  Group Date: 10/21/2023 Start Time: 1015 End Time: 1040 Facilitators: Ardena Gangl-McCall, LRT,CTRS Location: [address] Dayroom   Group Topic: Wellness  Goal Area(s) Addresses:  Patient will define components of whole wellness. Patient will verbalize benefit of whole wellness.  Intervention: Music  Group Description: LRT and patients discussed the importance of mental, physical and spiritual health. LRT led patients in a series of stretches to get loose. Patients then took turns leading the group in the exercises of their choosing. Patients were informed to take breaks or get water  as needed.   Education: Wellness, Building Control Surveyor.   Education Outcome: Acknowledges education/In group clarification offered/Needs additional education.    Affect/Mood: N/A   Participation Level: Did not attend    Clinical Observations/Individualized Feedback:    Plan: Continue to engage patient in RT group sessions 2-3x/week.   Jenna Wilson, LRT,CTRS 10/21/2023 1:15 PM

## 2023-10-21 NOTE — Plan of Care (Signed)
   Problem: Education: Goal: Knowledge of [facility] Education information/materials will improve Outcome: Progressing Goal: Emotional status will improve Outcome: Progressing Goal: Mental status will improve Outcome: Progressing Goal: Verbalization of understanding the information provided will improve Outcome: Progressing

## 2023-10-21 NOTE — [facility] Group Notes (Signed)
 Adult Psychoeducational Group Note  Date:  10/21/2023 Time:  8:00 PM  Group Topic/Focus:  Goals Group:   The focus of this group is to help patients establish daily goals to achieve during treatment and discuss how the patient can incorporate goal setting into their daily lives to aide in recovery. Orientation:   The focus of this group is to educate the patient on the purpose and policies of crisis stabilization and provide a format to answer questions about their admission.  The group details unit policies and expectations of patients while admitted.  Participation Level:  Did Not Attend  Participation Quality:    Affect:    Cognitive:    Insight:   Engagement in Group:    Modes of Intervention:    Additional Comments:    Jenna Wilson 10/21/2023, 8:00 PM

## 2023-10-22 MED ORDER — QUETIAPINE FUMARATE 100 MG PO TABS
100.0000 mg | ORAL_TABLET | Freq: Every day | ORAL | Status: DC
Start: 2023-10-23 — End: 2023-10-23
  Administered 2023-10-23: 100 mg via ORAL
  Filled 2023-10-22 (×3): qty 1

## 2023-10-22 NOTE — Plan of Care (Signed)
  Problem: Education: Goal: Knowledge of [facility] Education information/materials will improve Outcome: Progressing Goal: Emotional status will improve Outcome: Progressing Goal: Mental status will improve Outcome: Progressing Goal: Verbalization of understanding the information provided will improve Outcome: Progressing   Problem: Coping: Goal: Ability to demonstrate self-control will improve Outcome: Progressing

## 2023-10-22 NOTE — Group Note (Signed)
 Recreation Therapy Group Note   Group Topic:Communication  Group Date: 10/22/2023 Start Time: 1000 End Time: 1027 Facilitators: Arlett Goold-McCall, LRT,CTRS Location: [address] Dayroom   Group Topic: Communication, Problem Solving   Goal Area(s) Addresses:  Patient will effectively listen to complete activity.  Patient will identify communication skills used to make activity successful.  Patient will identify how skills used during activity can be used to reach post d/c goals.    Intervention: Building Surveyor Activity - Geometric pattern cards, pencils, blank paper    Group Description: Geometric Drawings.  Three volunteers from the peer group will be shown an abstract picture with a particular arrangement of geometrical shapes.  Each round, one 'speaker' will describe the pattern, as accurately as possible without revealing the image to the group.  The remaining group members will listen and draw the picture to reflect how it is described to them. Patients with the role of 'listener' cannot ask clarifying questions but, may request that the speaker repeat a direction. Once the drawings are complete, the presenter will show the rest of the group the picture and compare how close each person came to drawing the picture. LRT will facilitate a post-activity discussion regarding effective communication and the importance of planning, listening, and asking for clarification in daily interactions with others.  Education: Environmental consultant, Active listening, Support systems, Discharge planning  Education Outcome: Acknowledges understanding/In group clarification offered/Needs additional education.    Affect/Mood: N/A   Participation Level: Did not attend    Clinical Observations/Individualized Feedback:    Plan: Continue to engage patient in RT group sessions 2-3x/week.   Nikolai Wilczak-McCall, LRT,CTRS 10/22/2023 12:22 PM

## 2023-10-22 NOTE — Plan of Care (Signed)
  Problem: Education: Goal: Emotional status will improve Outcome: Progressing Goal: Verbalization of understanding the information provided will improve Outcome: Progressing   Problem: Activity: Goal: Interest or engagement in activities will improve Outcome: Progressing Goal: Sleeping patterns will improve Outcome: Progressing   Problem: Coping: Goal: Ability to verbalize frustrations and anger appropriately will improve Outcome: Progressing   Problem: Health Behavior/Discharge Planning: Goal: Identification of resources available to assist in meeting health care needs will improve Outcome: Progressing

## 2023-10-22 NOTE — Progress Notes (Signed)
 [facility] MD Progress Note  10/22/2023 10:20 AM Jenna Wilson  MRN:  [id number] Subjective:   Jenna Wilson is a 33 yo patient with a PPH of Bipolar d/o, PTSD, GAD, Stimulant use d/o-cocaine, Postpartum depression, and ADHD who presented to [facility] via EMS after intentional OD on Klonopin  and Seroquel  ( EMR notes that patient had some inconsistently in reports on what she took). Patient transferred to [facility] after being declared medical stable.   Per nursing report, patient requested as needed Seroquel  yesterday however, there was concern for oversedation given recent dose of Klonopin  administration.  Patient subsequently escalated the situation when attention was drawn to another patient who was reporting chest pain, patient began endorsing chest pain.  Workup was done by staff and EMS came however patient then refused to go with EMS and also endorsed that she is no longer having chest pain.  Patient never did receive as needed Seroquel , patient was able to return to her room for the rest of the night.  This a.m. patient did receive as needed Seroquel  per her request.  On initial assessment today patient reports that she feels she was being mistreated yesterday.  Patient reports that she believes the chest pain she was having was related to anger about her thoughts of being mistreated.  Patient reports that she eventually removed herself from the situation by returning to her room and her chest pain resided.  Patient reports that she feels that things did not happen as fast as they should have, but denies any chest pain today.  Patient and provider discussed ways for patient to self-regulate.  Patient reports she will continue to remove herself from the situation.  Discussed with patient the need for her to continue to work on regulating her emotions and decrease impulsivity in order to reach a place acceptable for discharge.  Patient endorsed that she wants to be discharged soon as she has now found 2 facilities willing to  accept her for housing.  Patient denies SI, HI and AVH today on assessment.  Patient reports she did not sleep well last night due to having some pain after her painful bowel movement.  Patient reports this is why she requested her early morning Seroquel  so she could rest today.   Principal Problem: Bipolar I disorder with depression, severe (HCC) Diagnosis: Principal Problem:   Bipolar I disorder with depression, severe (HCC) Active Problems:   Chronic post-traumatic stress disorder (PTSD)   Cocaine abuse with cocaine-induced mood disorder (HCC)   Intentional benzodiazepine overdose, initial encounter (HCC)   Hypokalemia   Intentional overdose (HCC)   Borderline personality disorder (HCC)  Total Time spent with patient: 20 minutes  Past Psychiatric History: INPT: Multiple hospitalization including 2018 in [facility], endorses first hospital occurred around age [age] yo OPT: Current, NP Tinnie Garret with [facility] Therapy: Yes in the past including EMDR Previous meds: Prazosin , Klonopin , Seroquel , Propanolol, Lithium  (became hypersexual on this), reports all typical antidepressants worsen my SI endorses she has been on remeron, Lexapro, Prozac, Zoloft , buspar  (made her lightheaded). Denies Effexor or Cymbalta or wellbutrin   Past Medical History:  Past Medical History:  Diagnosis Date   Anxiety    Bipolar 1 disorder (HCC)    Borderline personality disorder (HCC)    Chronic post-traumatic stress disorder (PTSD)    Depression    History of psychiatric hospitalization    Hx of drug abuse (HCC)    past heroin addict   Insomnia    Suicide attempt (HCC)  Past Surgical History:  Procedure Laterality Date   NO PAST SURGERIES     Family History:  Family History  Problem Relation Age of Onset   Alcohol abuse Neg Hx    Arthritis Neg Hx    Asthma Neg Hx    Birth defects Neg Hx    Cancer Neg Hx    COPD Neg Hx    Depression Neg Hx    Diabetes Neg Hx    Drug abuse Neg Hx     Early death Neg Hx    Hearing loss Neg Hx    Heart disease Neg Hx    Hyperlipidemia Neg Hx    Hypertension Neg Hx    Kidney disease Neg Hx    Learning disabilities Neg Hx    Mental illness Neg Hx    Mental retardation Neg Hx    Miscarriages / Stillbirths Neg Hx    Stroke Neg Hx    Vision loss Neg Hx    Varicose Veins Neg Hx    Family Psychiatric  History: Mom: Anxiety Younger brother: SUD> died from OD Social History:  Social History   Substance and Sexual Activity  Alcohol Use No   Comment: None in over a month     Social History   Substance and Sexual Activity  Drug Use Yes   Types: Crack cocaine   Comment: pt reports smoking crack this month    Social History   Socioeconomic History   Marital status: Married    Spouse name: Not on file   Number of children: Not on file   Years of education: Not on file   Highest education level: Not on file  Occupational History   Not on file  Tobacco Use   Smoking status: Every Day    Current packs/day: 1.00    Average packs/day: 1 pack/day for 14.0 years (14.0 ttl pk-yrs)    Types: Cigarettes   Smokeless tobacco: Never  Substance and Sexual Activity   Alcohol use: No    Comment: None in over a month   Drug use: Yes    Types: Crack cocaine    Comment: pt reports smoking crack this month   Sexual activity: Yes    Birth control/protection: None, Condom  Other Topics Concern   Not on file  Social History Narrative   Not on file   Social Drivers of Health   Financial Resource Strain: Not on file  Food Insecurity: Patient Unable To Answer (10/16/2023)   Hunger Vital Sign    Worried About Running Out of Food in the Last Year: Patient unable to answer    Ran Out of Food in the Last Year: Patient unable to answer  Transportation Needs: Patient Unable To Answer (10/16/2023)   PRAPARE - Administrator, Civil Service (Medical): Patient unable to answer    Lack of Transportation (Non-Medical): Patient unable to  answer  Physical Activity: Not on file  Stress: Not on file  Social Connections: Not on file   Additional Social History:                         Sleep: Poor  Appetite:  Poor  Current Medications: Current Facility-Administered Medications  Medication Dose Route Frequency Provider Last Rate Last Admin   acetaminophen  (TYLENOL ) tablet 650 mg  650 mg Oral Q6H PRN Wilkie Majel RAMAN, FNP   650 mg at 10/21/23 2354   alum & mag hydroxide-simeth (MAALOX/MYLANTA) 200-200-20 MG/5ML suspension  30 mL  30 mL Oral Q4H PRN Wilkie Majel RAMAN, FNP       buPROPion  (WELLBUTRIN  XL) 24 hr tablet 150 mg  150 mg Oral Daily Ewelina Naves B, MD   150 mg at 10/22/23 9173   clonazePAM  (KLONOPIN ) tablet 1 mg  1 mg Oral TID Parker, Alvin S, MD   1 mg at 10/22/23 9173   magnesium  hydroxide (MILK OF MAGNESIA) suspension 30 mL  30 mL Oral Daily PRN Starkes-Perry, Takia S, FNP   30 mL at 10/19/23 2110   melatonin tablet 10 mg  10 mg Oral QHS Williams, Roy, NP   10 mg at 10/21/23 2348   multivitamin with minerals tablet 1 tablet  1 tablet Oral Daily Wilkie Majel RAMAN, FNP   1 tablet at 10/22/23 9173   nicotine  (NICODERM CQ  - dosed in mg/24 hours) patch 21 mg  21 mg Transdermal Daily Starkes-Perry, Takia S, FNP   21 mg at 10/22/23 9173   OLANZapine  (ZYPREXA ) injection 5 mg  5 mg Intramuscular TID PRN Jerard Bays B, MD       polyethylene glycol (MIRALAX  / GLYCOLAX ) packet 17 g  17 g Oral Daily Alijah Hyde B, MD   17 g at 10/20/23 0756   prazosin  (MINIPRESS ) capsule 1 mg  1 mg Oral BID Zharia Conrow B, MD   1 mg at 10/22/23 9173   propranolol  (INDERAL ) tablet 10 mg  10 mg Oral BID Starkes-Perry, Takia S, FNP   10 mg at 10/22/23 9173   QUEtiapine  (SEROQUEL ) tablet 100 mg  100 mg Oral BID PRN Dniya Neuhaus B, MD   100 mg at 10/22/23 9173   QUEtiapine  (SEROQUEL ) tablet 400 mg  400 mg Oral QHS Ilina Xu B, MD   400 mg at 10/21/23 2042   thiamine  (Vitamin B-1) tablet 100 mg  100 mg Oral Daily  Wilkie Majel RAMAN, FNP   100 mg at 10/22/23 9173    Lab Results:  No results found for this or any previous visit (from the past 48 hours).   Blood Alcohol level:  Lab Results  Component Value Date   ETH <10 10/14/2023   ETH <10 03/23/2018    Metabolic Disorder Labs: Lab Results  Component Value Date   HGBA1C 5.1 10/15/2023   MPG 100 10/15/2023   No results found for: PROLACTIN Lab Results  Component Value Date   CHOL 119 10/15/2023   TRIG 40 10/15/2023   HDL 48 10/15/2023   CHOLHDL 2.5 10/15/2023   VLDL 8 10/15/2023   LDLCALC 63 10/15/2023    Physical Findings: AIMS:  , ,  ,  ,    CIWA:    COWS:     Musculoskeletal: Strength & Muscle Tone: within normal limits Gait & Station: normal Patient leans: N/A  Psychiatric Specialty Exam:  Presentation  General Appearance:  Casual  Eye Contact: Fair  Speech: Clear and Coherent  Speech Volume: Normal  Handedness:No data recorded  Mood and Affect  Mood: Irritable  Affect: Appropriate   Thought Process  Thought Processes: Coherent  Descriptions of Associations:Intact  Orientation:Full (Time, Place and Person)  Thought Content:Perseveration  History of Schizophrenia/Schizoaffective disorder:No data recorded Duration of Psychotic Symptoms:No data recorded Hallucinations:Hallucinations: None  Ideas of Reference:None  Suicidal Thoughts:Suicidal Thoughts: No  Homicidal Thoughts:Homicidal Thoughts: No   Sensorium  Memory: Immediate Good; Recent Good  Judgment: -- (mild improvement)  Insight: Shallow   Executive Functions  Concentration: Fair  Attention Span: Fair  Recall: [employer] of  Knowledge: Fair  Language: Fair   Psychomotor Activity  Psychomotor Activity: Psychomotor Activity: Normal   Assets  Assets: Resilience   Sleep  Sleep: Sleep: Fair Number of Hours of Sleep: 7.5    Physical Exam: Physical Exam Constitutional:      Appearance:  Normal appearance.  HENT:     Head: Normocephalic and atraumatic.  Pulmonary:     Effort: Pulmonary effort is normal.  Neurological:     Mental Status: She is alert and oriented to person, place, and time.    Review of Systems  Gastrointestinal:  Positive for constipation.  Psychiatric/Behavioral:  Positive for depression. Negative for hallucinations and suicidal ideas. The patient is nervous/anxious. The patient does not have insomnia.    Blood pressure 112/79, pulse 81, temperature (!) 97.5 F (36.4 C), temperature source Oral, resp. rate 18, height 5' 2 (1.575 m), weight 92.3 kg, SpO2 100%, unknown if currently breastfeeding. Body mass index is 37.2 kg/m.   Treatment Plan Summary: Daily contact with patient to assess and evaluate symptoms and progress in treatment and Medication management  Jenna Wilson is a 33 y.o. female who has a past medical history of Anxiety, Bipolar 1 disorder (HCC), Borderline personality disorder (HCC), Chronic post-traumatic stress disorder (PTSD), Depression, History of psychiatric hospitalization, drug abuse (HCC), Insomnia, and Suicide attempt (HCC). She presented from an outside hospital for Bipolar I disorder with depression, severe (HCC) [F31.4].    Patient still a bit irritable and continues to endorse that people are against her for personal reasons.  Patient does not appear paranoid, these appear to be more so characteristics of borderline personality as patient feels that no one is able to understand her.  Patient was able to endorse to some degree today that staff did need to take care of other patients situations and some may be more acute, which is some improvement in insight.  Patient endorses not having SI however continue to monitor emotional lability as she can be more impulsive when upset.  Patient ultimately did not require as needed Seroquel  yesterday during the event and was able to return to her room on her own.  Patient requested her as  needed Seroquel  today early in the a.m. we will plan for discharge tomorrow 10/23/2023.  Patient endorses that having a place to go helps decrease risk for relapse in SI.   Physician Treatment Plan for Secondary Diagnosis: Principal Problem:   Bipolar I disorder with depression, severe (HCC) Active Problems:   Chronic post-traumatic stress disorder (PTSD)   Cocaine abuse with cocaine-induced mood disorder (HCC)   Intentional benzodiazepine overdose, initial encounter (HCC)   Hypokalemia   Intentional overdose (HCC)   Chronic prescription benzodiazepine use  Hx of ADHD  Continued IVC on admission.   -- Increase Seroquel  to 100 daily and 400mg  at bedtime, for mood stabilization and insomnia -- Continue Klonopin  to 1mg  TID, for anxiety -- 0.5mg  daily PRN Klonopin , for anxiety --  Prazosin  1mg  BID, nightmares and severe hypervigilance -- Continue Propanolol 10mg  BID -- Continue Wellbutrin  XL 150mg  daily -- Chaplain consult --Mag citrate, constipation (completed 10/20/2023 did have bowel movement)   -- Thiamine  100mg  daily -- MV 1 tablet daily   PRN -Tylenol  650mg  q6h, pain -Maalox 30ml q4h, indigestion -Atarax  25mg  TID, anxiety -Milk of Mag 30mL, constipation   Agitation Protocol: Seroquel  100 mg twice daily as needed or Zyprexa  5mg  IM TID PRN for moderate to severe agitation and refuses PO.   Safety and Monitoring: INVOLUNTARY admission to inpatient psychiatric  unit for safety, stabilization and treatment Daily contact with patient to assess and evaluate symptoms and progress in treatment Patient's case to be discussed in multi-disciplinary team meeting Observation Level : q15 minute checks Vital signs: q12 hours Precautions: suicide, but pt currently verbally contracts for safety on unit    Discharge Planning: Social work and case management to assist with discharge planning and identification of hospital follow-up needs prior to discharge Estimated LOS: 5-7 days Discharge  Concerns: Need to establish a safety plan; Medication compliance and effectiveness Discharge Goals: Return home with outpatient referrals for mental health follow-up including medication management/psychotherapy.        Long Term Goal(s): Improvement in symptoms so as ready for discharge   Short Term Goals: Ability to identify changes in lifestyle to reduce recurrence of condition will improve, Ability to verbalize feelings will improve, Ability to disclose and discuss suicidal ideas, Ability to demonstrate self-control will improve, Ability to identify and develop effective coping behaviors will improve, Ability to maintain clinical measurements within normal limits will improve, Compliance with prescribed medications will improve, and Ability to identify triggers associated with substance abuse/mental health issues will improve   I certify that inpatient services furnished can reasonably be expected to improve the patient's condition.   PGY-4 Reggie KATHEE Rice, MD 10/22/2023, 10:20 AM

## 2023-10-22 NOTE — Progress Notes (Signed)
   10/22/23 2000  Psych Admission Type (Psych Patients Only)  Admission Status Involuntary  Psychosocial Assessment  Patient Complaints Anxiety  Eye Contact Brief  Facial Expression Anxious  Affect Anxious;Sullen  Speech Soft;Logical/coherent  Interaction Attention-seeking;Needy  Motor Activity Slow  Appearance/Hygiene Unremarkable  Behavior Characteristics Anxious  Mood Anxious  Thought Process  Coherency WDL  Content Blaming others  Delusions None reported or observed  Perception WDL  Hallucination None reported or observed  Judgment Poor  Confusion None  Danger to Self  Current suicidal ideation? Denies  Description of Suicide Plan None  Self-Injurious Behavior No self-injurious ideation or behavior indicators observed or expressed   Agreement Not to Harm Self Yes  Description of Agreement Verbal contract for safety  Danger to Others  Danger to Others None reported or observed

## 2023-10-22 NOTE — Progress Notes (Signed)
   10/22/23 0900  Psych Admission Type (Psych Patients Only)  Admission Status Involuntary  Psychosocial Assessment  Patient Complaints Anxiety  Eye Contact Brief  Facial Expression Anxious  Affect Flat  Speech Logical/coherent  Interaction Attention-seeking;Needy  Motor Activity Slow  Appearance/Hygiene Unremarkable  Behavior Characteristics Anxious  Mood Anxious  Thought Process  Coherency WDL  Content Blaming others  Delusions None reported or observed  Perception WDL  Hallucination None reported or observed  Judgment Poor  Confusion None  Danger to Self  Current suicidal ideation? Denies  Description of Suicide Plan none  Self-Injurious Behavior No self-injurious ideation or behavior indicators observed or expressed   Agreement Not to Harm Self Yes  Description of Agreement verbal  Danger to Others  Danger to Others None reported or observed

## 2023-10-22 NOTE — Group Note (Signed)
 Occupational Therapy Group Note  Group Topic: Sleep Hygiene  Group Date: 10/22/2023 Start Time: 1430 End Time: 1500 Facilitators: Dot Dallas MATSU, OT   Group Description: Group encouraged increased participation and engagement through topic focused on sleep hygiene. Patients reflected on the quality of sleep they typically receive and identified areas that need improvement. Group was given background information on sleep and sleep hygiene, including common sleep disorders. Group members also received information on how to improve one's sleep and introduced a sleep diary as a tool that can be utilized to track sleep quality over a length of time. Group session ended with patients identifying one or more strategies they could utilize or implement into their sleep routine in order to improve overall sleep quality.        Therapeutic Goal(s):  Identify one or more strategies to improve overall sleep hygiene  Identify one or more areas of sleep that are negatively impacted (sleep too much, too little, etc)     Participation Level: Engaged   Participation Quality: Independent   Behavior: Appropriate   Speech/Thought Process: Relevant   Affect/Mood: Appropriate   Insight: Fair   Judgement: Fair      Modes of Intervention: Education  Patient Response to Interventions:  Attentive   Plan: Continue to engage patient in OT groups 2 - 3x/week.  10/22/2023  D[place] MATSU Dot, OT   Mariela Rex, OT

## 2023-10-22 NOTE — BHH Group Notes (Signed)
 Adult Psychoeducational Group Note  Date:  10/22/2023 Time:  1:42 PM  Group Topic/Focus:  Goals Group:   The focus of this group is to help patients establish daily goals to achieve during treatment and discuss how the patient can incorporate goal setting into their daily lives to aide in recovery. Orientation:   The focus of this group is to educate the patient on the purpose and policies of crisis stabilization and provide a format to answer questions about their admission.  The group details unit policies and expectations of patients while admitted.  Participation Level:  Active  Participation Quality:  Appropriate  Affect:  Appropriate  Cognitive:  Appropriate  Insight: Appropriate  Engagement in Group:  Engaged  Modes of Intervention:  Discussion  Additional Comments:  Pt attended the goals group and remained appropriate and engaged throughout the duration of the group.   Alisyn Lequire O 10/22/2023, 1:42 PM

## 2023-10-22 NOTE — Progress Notes (Signed)
 Adult Psychoeducational Group Note  Date:  10/22/2023 Time:  9:03 PM  Group Topic/Focus:  Wrap-Up Group:   The focus of this group is to help patients review their daily goal of treatment and discuss progress on daily workbooks.  Participation Level:  Active  Participation Quality:  Appropriate  Affect:  Appropriate  Cognitive:  Appropriate  Insight: Appropriate  Engagement in Group:  Engaged  Modes of Intervention:  Discussion  Additional Comments:    Lang Donia Law 10/22/2023, 9:03 PM

## 2023-10-23 DIAGNOSIS — F314 Bipolar disorder, current episode depressed, severe, without psychotic features: Secondary | ICD-10-CM | POA: Diagnosis not present

## 2023-10-23 MED ORDER — CLONAZEPAM 1 MG PO TABS: 1.0000 mg | ORAL_TABLET | Freq: Three times a day (TID) | ORAL | 0 refills | Status: AC

## 2023-10-23 MED ORDER — QUETIAPINE FUMARATE 400 MG PO TABS: 400.0000 mg | ORAL_TABLET | Freq: Every day | ORAL | 0 refills | Status: AC

## 2023-10-23 MED ORDER — BUPROPION HCL ER (XL) 150 MG PO TB24: 150.0000 mg | ORAL_TABLET | Freq: Every day | ORAL | 0 refills | Status: AC

## 2023-10-23 MED ORDER — QUETIAPINE FUMARATE 50 MG PO TABS: 50.0000 mg | ORAL_TABLET | Freq: Every day | ORAL | 0 refills | Status: AC | PRN

## 2023-10-23 MED ORDER — PRAZOSIN HCL 1 MG PO CAPS: 1.0000 mg | ORAL_CAPSULE | Freq: Two times a day (BID) | ORAL | 0 refills | Status: AC

## 2023-10-23 MED ORDER — PROPRANOLOL HCL 10 MG PO TABS: 10.0000 mg | ORAL_TABLET | Freq: Two times a day (BID) | ORAL | 0 refills | Status: AC

## 2023-10-23 NOTE — Progress Notes (Signed)
  [facility] Adult Case Management Discharge Plan :  Will you be returning to the same living situation after discharge:  No. Pt will moving in friend, Clayborne Dess [phone] At discharge, do you have transportation home?: Yes,  pt has her car in the parking lot of [facility], CSW confirmed car in the parking lot. Do you have the ability to pay for your medications: Yes,  pt has active medical coverage.  Release of information consent forms completed and in the chart;  Patient's signature needed at discharge.  Patient to Follow up at:  Follow-up Information     [facility]. Go to.   Specialty: Professional Counselor Why: Please go to this provider for therapy services during walk in hours for new patients:  Monday through Friday, from 9 am to 1 pm. Contact information: [address]  [address] [place] [address] [phone]         [facility]. Go on 10/27/2023.   Specialty: Behavioral Health Why: Please go to this provider for medication management services.  Please go on 10/27/23 at 7:00 am, for same day service. Contact information: [phone] [place]  [phone]                Next level of care provider has access to [facility] Link:yes  Safety Planning and Suicide Prevention discussed: Yes,  Safety Planning completed with friend Clayborne Dess, [phone]     Has patient been referred to the Quitline?: Patient refused referral for treatment  Patient has been referred for addiction treatment: Patient refused referral for treatment.  Keyaira Clapham, Donia SAUNDERS, LCSWA 10/23/2023, 9:53 AM

## 2023-10-23 NOTE — Progress Notes (Signed)
 Discharge Note:  Patient denies SI/HI/AVH at this time. Discharge instructions, AVS, prescriptions, and transition record gone over with patient. Patient agrees to comply with medication management, follow-up visit, and outpatient therapy. Patient belongings returned to patient. Patient questions and concerns addressed and answered. Patient ambulatory off unit. Patient discharged to shelter with own vehicle.

## 2023-10-23 NOTE — Progress Notes (Signed)
   10/23/23 0800  Psych Admission Type (Psych Patients Only)  Admission Status Involuntary  Psychosocial Assessment  Patient Complaints None;Anxiety  Eye Contact Fair  Facial Expression Anxious  Affect Flat  Speech Logical/coherent  Interaction Assertive  Motor Activity Slow  Appearance/Hygiene Unremarkable  Behavior Characteristics Anxious  Mood Anxious  Thought Process  Coherency WDL  Content Blaming others  Delusions None reported or observed  Perception WDL  Hallucination None reported or observed  Judgment Poor  Confusion None  Danger to Self  Current suicidal ideation? Denies  Description of Suicide Plan none  Self-Injurious Behavior No self-injurious ideation or behavior indicators observed or expressed   Agreement Not to Harm Self Yes  Description of Agreement verbal contract  Danger to Others  Danger to Others None reported or observed

## 2023-10-23 NOTE — Group Note (Signed)
 Recreation Therapy Group Note   Group Topic:Team Building  Group Date: 10/23/2023 Start Time: 1012 End Time: 1040 Facilitators: Tammi Boulier-McCall, LRT,CTRS Location: [address] [employer]   Group Topic: Team Building, Problem Solving  Goal Area(s) Addresses:  Patient will effectively work with peer towards shared goal.  Patient will identify skills used to make activity successful.  Patient will identify how skills used during activity can be applied to reach post d/c goals.   Intervention: STEM Activity- Glass Blower/designer  Group Description: Tallest Pharmacist, Community. In teams of 5-6, patients were given 11 craft pipe cleaners. Using the materials provided, patients were instructed to compete again the opposing team(s) to build the tallest free-standing structure from floor level. The activity was timed; difficulty increased by clinical research associate as production designer, theatre/television/film continued.  Systematically resources were removed with additional directions for example, placing one arm behind their back, working in silence, and shape stipulations. LRT facilitated post-activity discussion reviewing team processes and necessary communication skills involved in completion. Patients were encouraged to reflect how the skills utilized, or not utilized, in this activity can be incorporated to positively impact support systems post discharge.  Education: Pharmacist, Community, Scientist, Physiological, Discharge Planning   Education Outcome: Acknowledges education/In group clarification offered/Needs additional education.    Affect/Mood: N/A   Participation Level: Did not attend    Clinical Observations/Individualized Feedback:     Plan: Continue to engage patient in RT group sessions 2-3x/week.   Brielynn Sekula-McCall, LRT,CTRS  10/23/2023 12:28 PM

## 2023-10-23 NOTE — Plan of Care (Signed)
  Problem: Education: Goal: Emotional status will improve Outcome: Progressing Goal: Verbalization of understanding the information provided will improve Outcome: Progressing   Problem: Activity: Goal: Sleeping patterns will improve Outcome: Progressing   Problem: Coping: Goal: Ability to demonstrate self-control will improve Outcome: Progressing   Problem: Physical Regulation: Goal: Ability to maintain clinical measurements within normal limits will improve Outcome: Progressing   Problem: Education: Goal: Ability to state activities that reduce stress will improve Outcome: Progressing

## 2023-10-23 NOTE — [facility] Suicide Risk Assessment (Signed)
 [facility] Discharge Suicide Risk Assessment   Principal Problem: Bipolar I disorder with depression, severe (HCC)  Discharge Diagnoses: Principal Problem:   Bipolar I disorder with depression, severe (HCC) Active Problems:   Chronic post-traumatic stress disorder (PTSD)   Cocaine abuse with cocaine-induced mood disorder (HCC)   Intentional benzodiazepine overdose, initial encounter (HCC)   Hypokalemia   Intentional overdose (HCC)   Borderline personality disorder (HCC)      Total Time spent with patient: 40  Musculoskeletal: Strength & Muscle Tone: within normal limits Gait & Station: normal Patient leans: N/A   Psychiatric Specialty Exam:  Presentation  General Appearance: Appropriate for Environment  Eye Contact: Good  Speech: Normal Rate  Speech Volume: Normal  Handedness: No data recorded  Mood and Affect  Mood: Euthymic  Affect: Full Range   Thought Process  Thought Processes: Coherent  Descriptions of Associations: Intact  Orientation: Full (Time, Place and Person)  Thought Content: Logical  History of Schizophrenia/Schizoaffective disorder: No data recorded Duration of Psychotic Symptoms: NA Hallucinations: Hallucinations: None  Ideas of Reference: None  Suicidal Thoughts: Suicidal Thoughts: No  Homicidal Thoughts: Homicidal Thoughts: No   Sensorium  Memory: Immediate Good; Recent Good  Judgment: Fair  Insight: Fair   Art Therapist  Concentration: Good  Attention Span: Good  Recall: Good  Fund of Knowledge: Good  Language: Good   Psychomotor Activity  Psychomotor Activity: Psychomotor Activity: Normal   Assets  Assets: Communication Skills; Talents/Skills   Sleep  Sleep: Sleep: Good   Physical Exam: General: Sitting comfortably. NAD. HEENT: Normocephalic, atraumatic, MMM, EMOI Lungs: no increased work of breathing noted Heart: no cyanosis Abdomen: Non distended Musculoskeletal: FROM. No obvious  deformities Skin: Warm, dry, intact. No rashes noted Neuro: No obvious focal deficits.  Gait and station are normal  Review of Systems  Constitutional: Negative.   HENT: Negative.    Eyes: Negative.   Respiratory: Negative.    Cardiovascular: Negative.   Gastrointestinal: Negative.   Genitourinary: Negative.   Skin: Negative.   Neurological: Negative.   Psychiatric/Behavioral:  Negative   Mental Status Per Nursing Assessment: NA  Demographic Factors:  Caucasian, Low socioeconomic status, and Unemployed  Loss Factors: Decrease in vocational status and Loss of significant relationship  Historical Factors: Prior suicide attempts, Family history of mental illness or substance abuse, and Impulsivity  Risk Reduction Factors:   Religious beliefs about death, Positive social support, and Positive therapeutic relationship  Continued Clinical Symptoms:  Previous psychiatric diagnoses and treatments  Cognitive Features That Contribute To Risk:  None  Suicide Risk:  Minimal: No identifiable suicidal ideation.  Patients presenting with no risk factors but with morbid ruminations; may be classified as minimal risk based on the severity of the depressive symptoms.    Follow-up Information     [facility] Go to.   Specialty: Professional Counselor Why: Please go to this provider for therapy services during walk in hours for new patients:  Monday through Friday, from 9 am to 1 pm. Contact information: [address]  [address] [place] [address] [phone]         [facility]. Go on 10/27/2023.   Specialty: Behavioral Health Why: Please go to this provider for medication management services.  Please go on 10/27/23 at 7:00 am, for same day service. Contact information: [address] [place]  [id number] [phone]                 Plan Of Care/Follow-up recommendations:  Activity: as  tolerated  Diet: heart healthy  Other: -Follow-up with your outpatient psychiatric provider -instructions on appointment date, time, and address (location) are provided to you in discharge paperwork.  -Take your psychiatric medications as prescribed at discharge - instructions are provided to you in the discharge paperwork  -Follow-up with outpatient primary care doctor and other specialists -for management of preventative medicine and chronic medical issues  -Testing: Follow-up with outpatient provider for abnormal lab results: NA  -If you are prescribed an atypical antipsychotic medication, we recommend that your outpatient psychiatrist follow routine screening for side effects within 3 months of discharge, including monitoring: AIMS scale, height, weight, blood pressure, fasting lipid panel, HbA1c, and fasting blood sugar.   -Recommend total abstinence from alcohol, tobacco, and other illicit drug use at discharge.   -If your psychiatric symptoms recur, worsen, or if you have side effects to your psychiatric medications, call your outpatient psychiatric provider, 911, [phone] or go to the nearest emergency department.  -If suicidal thoughts occur, immediately call your outpatient psychiatric provider, 911, [phone] or go to the nearest emergency department.   Glendia Kitty, MD 10/23/23 12:37 PM
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
